# Patient Record
Sex: Female | Born: 1970
Health system: Southern US, Community
[De-identification: ages and names within clinical notes are randomized; demographics above are authoritative.]

## PROBLEM LIST (undated history)

## (undated) DIAGNOSIS — I10 Essential (primary) hypertension: Secondary | ICD-10-CM

## (undated) DIAGNOSIS — K219 Gastro-esophageal reflux disease without esophagitis: Secondary | ICD-10-CM

## (undated) DIAGNOSIS — T8859XA Other complications of anesthesia, initial encounter: Secondary | ICD-10-CM

## (undated) DIAGNOSIS — D649 Anemia, unspecified: Secondary | ICD-10-CM

## (undated) DIAGNOSIS — R011 Cardiac murmur, unspecified: Secondary | ICD-10-CM

## (undated) DIAGNOSIS — Z5189 Encounter for other specified aftercare: Secondary | ICD-10-CM

## (undated) DIAGNOSIS — F419 Anxiety disorder, unspecified: Secondary | ICD-10-CM

## (undated) HISTORY — PX: IUD REMOVAL: SHX5392

## (undated) HISTORY — DX: Gastro-esophageal reflux disease without esophagitis: K21.9

## (undated) HISTORY — DX: Essential (primary) hypertension: I10

## (undated) HISTORY — DX: Anxiety disorder, unspecified: F41.9

## (undated) HISTORY — DX: Encounter for other specified aftercare: Z51.89

## (undated) HISTORY — PX: LAPAROSCOPIC GASTRIC BANDING: SHX1100

## (undated) HISTORY — PX: TUBAL LIGATION: SHX77

---

## 2002-11-05 ENCOUNTER — Emergency Department (HOSPITAL_COMMUNITY): Admission: EM | Admit: 2002-11-05 | Discharge: 2002-11-05 | Payer: Self-pay | Admitting: Emergency Medicine

## 2010-02-23 ENCOUNTER — Ambulatory Visit: Payer: Self-pay | Admitting: Nurse Practitioner

## 2010-02-23 ENCOUNTER — Inpatient Hospital Stay (HOSPITAL_COMMUNITY)
Admission: AD | Admit: 2010-02-23 | Discharge: 2010-02-23 | Payer: Self-pay | Source: Home / Self Care | Admitting: Family Medicine

## 2010-07-13 LAB — HERPES SIMPLEX VIRUS CULTURE

## 2010-07-13 LAB — WET PREP, GENITAL
Trich, Wet Prep: NONE SEEN
Yeast Wet Prep HPF POC: NONE SEEN

## 2010-07-13 LAB — GC/CHLAMYDIA PROBE AMP, GENITAL

## 2011-06-09 ENCOUNTER — Encounter (HOSPITAL_BASED_OUTPATIENT_CLINIC_OR_DEPARTMENT_OTHER): Payer: Self-pay | Admitting: *Deleted

## 2011-06-09 ENCOUNTER — Emergency Department (HOSPITAL_BASED_OUTPATIENT_CLINIC_OR_DEPARTMENT_OTHER)
Admission: EM | Admit: 2011-06-09 | Discharge: 2011-06-09 | Disposition: A | Discharge status: Home / Self Care | Payer: 59 | Attending: Emergency Medicine | Admitting: Emergency Medicine

## 2011-06-09 DIAGNOSIS — J029 Acute pharyngitis, unspecified: Secondary | ICD-10-CM | POA: Insufficient documentation

## 2011-06-09 DIAGNOSIS — B349 Viral infection, unspecified: Secondary | ICD-10-CM

## 2011-06-09 DIAGNOSIS — D649 Anemia, unspecified: Secondary | ICD-10-CM | POA: Insufficient documentation

## 2011-06-09 DIAGNOSIS — B9789 Other viral agents as the cause of diseases classified elsewhere: Secondary | ICD-10-CM | POA: Insufficient documentation

## 2011-06-09 LAB — DIFFERENTIAL
Basophils Absolute: 0 10*3/uL (ref 0.0–0.1)
Eosinophils Absolute: 0 10*3/uL (ref 0.0–0.7)
Lymphocytes Relative: 9 % — ABNORMAL LOW (ref 12–46)
Lymphs Abs: 1.4 10*3/uL (ref 0.7–4.0)
Monocytes Absolute: 1.3 10*3/uL — ABNORMAL HIGH (ref 0.1–1.0)
Neutrophils Relative %: 83 % — ABNORMAL HIGH (ref 43–77)

## 2011-06-09 LAB — CBC
HCT: 22.3 % — ABNORMAL LOW (ref 36.0–46.0)
Hemoglobin: 6.1 g/dL — CL (ref 12.0–15.0)
RBC: 3.78 MIL/uL — ABNORMAL LOW (ref 3.87–5.11)

## 2011-06-09 LAB — BASIC METABOLIC PANEL
BUN: 9 mg/dL (ref 6–23)
Creatinine, Ser: 0.4 mg/dL — ABNORMAL LOW (ref 0.50–1.10)
GFR calc Af Amer: >90 mL/min (ref >90)
GFR calc non Af Amer: >90 mL/min (ref >90)

## 2011-06-09 LAB — RAPID STREP SCREEN (MED CTR MEBANE ONLY): Streptococcus, Group A Screen (Direct): NEGATIVE

## 2011-06-09 MED ORDER — FERROUS FUMARATE 50 MG PO TBCR: 50.0000 mg | EXTENDED_RELEASE_TABLET | Freq: Three times a day (TID) | ORAL | Status: DC

## 2011-06-09 MED ORDER — SODIUM CHLORIDE 0.9 % IV BOLUS (SEPSIS)
1000.0000 mL | Freq: Once | INTRAVENOUS | Status: AC
  Administered 2011-06-09: 1000 mL via INTRAVENOUS

## 2011-06-09 NOTE — ED Notes (Signed)
Sore throat for 3 days fever at intervals last took tylenol at 1100 today also having some right ear pain

## 2011-06-09 NOTE — ED Provider Notes (Signed)
History     CSN: [id number]  Arrival date & time 06/09/11  1331   First MD Initiated Contact with Patient 06/09/11 1356      Chief Complaint  Patient presents with  . Sore Throat    (Consider location/radiation/quality/duration/timing/severity/associated sxs/prior treatment) Patient is a 41 y.o. female presenting with pharyngitis. The history is provided by the patient.  Sore Throat This is a new problem. Episode onset: 3 days ago. The problem occurs constantly. The problem has not changed since onset.Pertinent negatives include no abdominal pain and no shortness of breath. Associated symptoms comments: Fever up to 101.7. No cough, dysuria, abdominal pain, nausea, vomiting. She states she has not eaten much because her throat hurts too badly.. The symptoms are aggravated by swallowing. The symptoms are relieved by nothing. She has tried acetaminophen for the symptoms. The treatment provided no relief.    History reviewed. No pertinent past medical history.  History reviewed. No pertinent past surgical history.  History reviewed. No pertinent family history.  History  Substance Use Topics  . Smoking status: Never Smoker   . Smokeless tobacco: Not on file  . Alcohol Use: No    OB History    Grav Para Term Preterm Abortions TAB SAB Ect Mult Living                  Review of Systems  Respiratory: Negative for shortness of breath.   Gastrointestinal: Negative for abdominal pain.  All other systems reviewed and are negative.    Allergies  Review of patient's allergies indicates no known allergies.  Home Medications  No current outpatient prescriptions on file.  BP 115/74  Pulse 98  Temp(Src) 98.3 F (36.8 C) (Oral)  Resp 20  SpO2 99%  LMP 05/05/2011  Physical Exam  Nursing note and vitals reviewed. Constitutional: She is oriented to person, place, and time. She appears well-developed and well-nourished. No distress.  HENT:  Head: Normocephalic and atraumatic.    Right Ear: Tympanic membrane and ear canal normal.  Left Ear: Tympanic membrane and ear canal normal.  Nose: Mucosal edema present. No rhinorrhea.  Mouth/Throat: Mucous membranes are normal. Posterior oropharyngeal erythema present. No oropharyngeal exudate.  Eyes: EOM are normal. Pupils are equal, round, and reactive to light.  Cardiovascular: Normal rate, regular rhythm and intact distal pulses.  Exam reveals no friction rub.   Murmur heard. Pulmonary/Chest: Effort normal and breath sounds normal. She has no wheezes. She has no rales.  Abdominal: Soft. Bowel sounds are normal. She exhibits no distension. There is no tenderness. There is no rebound and no guarding.  Musculoskeletal: Normal range of motion. She exhibits no tenderness.       No edema  Lymphadenopathy:    She has cervical adenopathy.  Neurological: She is alert and oriented to person, place, and time. No cranial nerve deficit.  Skin: Skin is warm and dry. No rash noted.  Psychiatric: She has a normal mood and affect. Her behavior is normal.    ED Course  Procedures (including critical care time)  Labs Reviewed  CBC - Abnormal; Notable for the following:    WBC 15.7 (*)    RBC 3.78 (*)    Hemoglobin 6.1 (*)    HCT 22.3 (*)    MCV 59.0 (*)    MCH 16.1 (*)    MCHC 27.4 (*)    RDW 20.5 (*)    All other components within normal limits  BASIC METABOLIC PANEL - Abnormal; Notable for the following:  Potassium 3.3 (*)    Creatinine, Ser 0.40 (*)    All other components within normal limits  RAPID STREP SCREEN  DIFFERENTIAL   No results found.   No diagnosis found.    MDM   Pt with symptoms consistent with viral URI and pharyngitis.  Well appearing here.  No signs of breathing difficulty  No signs of otitis or abnormal abdominal findings.   Rapid strep neg.  Will have her continue po fluids and louzenges.   2:46 PM On reevaluation patient admits to having a lab a.m. procedure done 6 years ago having  intermittent bouts of dehydration and hypokalemia. She also states she's been anemic for 2 years with hemoglobins as low as 6 or 7. She's always refused transfusions. She states that she has very heavy periods and that is most likely causing her symptoms of anemia. She has a murmur today and it seems that her hemoglobin is not much higher than 7. She states it was last checked in November and was around 7. She denies any dizziness, shortness of breath, chest pain or other symptoms of anemia. Will give IV fluids and check a CBC and BMP.  3:35 PM CBC with a leukocytosis of 15,000 and anemia with a hemoglobin of 6.1. Given her prior history of this is not far off her normal. She did not want blood transfusions but will start iron. BMP within normal limits.  Will give referrals for OB/GYN and PCP followup.   Gwyneth Sprout, MD 06/09/11 [phone]

## 2012-08-26 ENCOUNTER — Other Ambulatory Visit: Payer: Self-pay

## 2012-10-02 ENCOUNTER — Other Ambulatory Visit: Payer: Self-pay

## 2012-10-02 DIAGNOSIS — Z1231 Encounter for screening mammogram for malignant neoplasm of breast: Secondary | ICD-10-CM

## 2012-10-03 ENCOUNTER — Telehealth: Payer: Self-pay | Admitting: Oncology

## 2012-10-03 NOTE — Telephone Encounter (Signed)
S/W PT IN RE NP APPT ON 06/12 @ 3 W/DR. HA REFERRING DR. Susa Day MODY DX- ABN CBC  WELCOME PACKET MAILED.

## 2012-10-04 ENCOUNTER — Telehealth: Payer: Self-pay | Admitting: Oncology

## 2012-10-04 NOTE — Telephone Encounter (Signed)
C/D 10/04/12 for appt. 10/10/12 °

## 2012-10-04 NOTE — Telephone Encounter (Signed)
C/D 10/04/12 for appt. 10/10/12

## 2012-10-10 ENCOUNTER — Ambulatory Visit: Payer: 59

## 2012-10-10 ENCOUNTER — Other Ambulatory Visit: Payer: 59 | Admitting: Lab

## 2012-10-10 ENCOUNTER — Ambulatory Visit: Payer: 59 | Admitting: Oncology

## 2012-10-11 ENCOUNTER — Ambulatory Visit: Payer: 59

## 2012-10-30 ENCOUNTER — Ambulatory Visit: Payer: 59

## 2012-11-13 ENCOUNTER — Ambulatory Visit
Admission: RE | Admit: 2012-11-13 | Discharge: 2012-11-13 | Disposition: A | Discharge status: Home / Self Care | Payer: 59 | Source: Ambulatory Visit

## 2012-11-13 DIAGNOSIS — Z1231 Encounter for screening mammogram for malignant neoplasm of breast: Secondary | ICD-10-CM

## 2014-09-01 ENCOUNTER — Encounter (HOSPITAL_COMMUNITY): Payer: Self-pay | Admitting: *Deleted

## 2014-09-01 ENCOUNTER — Emergency Department (HOSPITAL_COMMUNITY): Payer: No Typology Code available for payment source

## 2014-09-01 ENCOUNTER — Emergency Department (HOSPITAL_COMMUNITY)
Admission: EM | Admit: 2014-09-01 | Discharge: 2014-09-01 | Disposition: A | Discharge status: Home / Self Care | Payer: No Typology Code available for payment source | Attending: Emergency Medicine | Admitting: Emergency Medicine

## 2014-09-01 DIAGNOSIS — Y9389 Activity, other specified: Secondary | ICD-10-CM | POA: Insufficient documentation

## 2014-09-01 DIAGNOSIS — S20212A Contusion of left front wall of thorax, initial encounter: Secondary | ICD-10-CM | POA: Insufficient documentation

## 2014-09-01 DIAGNOSIS — S161XXA Strain of muscle, fascia and tendon at neck level, initial encounter: Secondary | ICD-10-CM | POA: Insufficient documentation

## 2014-09-01 DIAGNOSIS — S4992XA Unspecified injury of left shoulder and upper arm, initial encounter: Secondary | ICD-10-CM | POA: Diagnosis not present

## 2014-09-01 DIAGNOSIS — Y92410 Unspecified street and highway as the place of occurrence of the external cause: Secondary | ICD-10-CM | POA: Insufficient documentation

## 2014-09-01 DIAGNOSIS — Y998 Other external cause status: Secondary | ICD-10-CM | POA: Insufficient documentation

## 2014-09-01 DIAGNOSIS — S199XXA Unspecified injury of neck, initial encounter: Secondary | ICD-10-CM | POA: Diagnosis present

## 2014-09-01 MED ORDER — NAPROXEN 500 MG PO TABS: 500.0000 mg | ORAL_TABLET | Freq: Two times a day (BID) | ORAL | Status: DC

## 2014-09-01 MED ORDER — ORPHENADRINE CITRATE ER 100 MG PO TB12: 100.0000 mg | ORAL_TABLET | Freq: Two times a day (BID) | ORAL | Status: DC

## 2014-09-01 MED ORDER — CYCLOBENZAPRINE HCL 10 MG PO TABS
10.0000 mg | ORAL_TABLET | Freq: Once | ORAL | Status: AC
  Administered 2014-09-01: 10 mg via ORAL
  Filled 2014-09-01: qty 1

## 2014-09-01 MED ORDER — KETOROLAC TROMETHAMINE 60 MG/2ML IM SOLN
60.0000 mg | Freq: Once | INTRAMUSCULAR | Status: AC
  Administered 2014-09-01: 60 mg via INTRAMUSCULAR
  Filled 2014-09-01: qty 2

## 2014-09-01 NOTE — ED Notes (Signed)
I gave the patient a pair of tan large socks. 

## 2014-09-01 NOTE — Discharge Instructions (Signed)
Cervical Sprain A cervical sprain is an injury in the neck in which the strong, fibrous tissues (ligaments) that connect your neck bones stretch or tear. Cervical sprains can range from mild to severe. Severe cervical sprains can cause the neck vertebrae to be unstable. This can lead to damage of the spinal cord and can result in serious nervous system problems. The amount of time it takes for a cervical sprain to get better depends on the cause and extent of the injury. Most cervical sprains heal in 1 to 3 weeks. CAUSES  Severe cervical sprains may be caused by:   Contact sport injuries (such as from football, rugby, wrestling, hockey, auto racing, gymnastics, diving, martial arts, or boxing).   Motor vehicle collisions.   Whiplash injuries. This is an injury from a sudden forward and backward whipping movement of the head and neck.  Falls.  Mild cervical sprains may be caused by:   Being in an awkward position, such as while cradling a telephone between your ear and shoulder.   Sitting in a chair that does not offer proper support.   Working at a poorly Landscape [employer] station.   Looking up or down for long periods of time.  SYMPTOMS   Pain, soreness, stiffness, or a burning sensation in the front, back, or sides of the neck. This discomfort may develop immediately after the injury or slowly, 24 hours or more after the injury.   Pain or tenderness directly in the middle of the back of the neck.   Shoulder or upper back pain.   Limited ability to move the neck.   Headache.   Dizziness.   Weakness, numbness, or tingling in the hands or arms.   Muscle spasms.   Difficulty swallowing or chewing.   Tenderness and swelling of the neck.  DIAGNOSIS  Most of the time your health care provider can diagnose a cervical sprain by taking your history and doing a physical exam. Your health care provider will ask about previous neck injuries and any known neck  problems, such as arthritis in the neck. X-rays may be taken to find out if there are any other problems, such as with the bones of the neck. Other tests, such as a CT scan or MRI, may also be needed.  TREATMENT  Treatment depends on the severity of the cervical sprain. Mild sprains can be treated with rest, keeping the neck in place (immobilization), and pain medicines. Severe cervical sprains are immediately immobilized. Further treatment is done to help with pain, muscle spasms, and other symptoms and may include:  Medicines, such as pain relievers, numbing medicines, or muscle relaxants.   Physical therapy. This may involve stretching exercises, strengthening exercises, and posture training. Exercises and improved posture can help stabilize the neck, strengthen muscles, and help stop symptoms from returning.  HOME CARE INSTRUCTIONS   Put ice on the injured area.   Put ice in a plastic bag.   Place a towel between your skin and the bag.   Leave the ice on for 15-20 minutes, 3-4 times a day.   If your injury was severe, you may have been given a cervical collar to wear. A cervical collar is a two-piece collar designed to keep your neck from moving while it heals.  Do not remove the collar unless instructed by your health care provider.  If you have long hair, keep it outside of the collar.  Ask your health care provider before making any adjustments to your collar. Minor  adjustments may be required over time to improve comfort and reduce pressure on your chin or on the back of your head.  Ifyou are allowed to remove the collar for cleaning or bathing, follow your health care provider's instructions on how to do so safely.  Keep your collar clean by wiping it with mild soap and water and drying it completely. If the collar you have been given includes removable pads, remove them every 1-2 days and hand wash them with soap and water. Allow them to air dry. They should be completely  dry before you wear them in the collar.  If you are allowed to remove the collar for cleaning and bathing, wash and dry the skin of your neck. Check your skin for irritation or sores. If you see any, tell your health care provider.  Do not drive while wearing the collar.   Only take over-the-counter or prescription medicines for pain, discomfort, or fever as directed by your health care provider.   Keep all follow-up appointments as directed by your health care provider.   Keep all physical therapy appointments as directed by your health care provider.   Make any needed adjustments to your workstation to promote good posture.   Avoid positions and activities that make your symptoms worse.   Warm up and stretch before being active to help prevent problems.  SEEK MEDICAL CARE IF:   Your pain is not controlled with medicine.   You are unable to decrease your pain medicine over time as planned.   Your activity level is not improving as expected.  SEEK IMMEDIATE MEDICAL CARE IF:   You develop any bleeding.  You develop stomach upset.  You have signs of an allergic reaction to your medicine.   Your symptoms get worse.   You develop new, unexplained symptoms.   You have numbness, tingling, weakness, or paralysis in any part of your body.  MAKE SURE YOU:   Understand these instructions.  Will watch your condition.  Will get help right away if you are not doing well or get worse. Document Released: 02/12/2007 Document Revised: 04/22/2013 Document Reviewed: 10/23/2012 [facility] Patient Information 2015 [place], [place]. This information is not intended to replace advice given to you by your health care provider. Make sure you discuss any questions you have with your health care provider.  Blunt Chest Trauma Blunt chest trauma is an injury caused by a blow to the chest. These chest injuries can be very painful. Blunt chest trauma often results in bruised or broken  (fractured) ribs. Most cases of bruised and fractured ribs from blunt chest traumas get better after 1 to 3 weeks of rest and pain medicine. Often, the soft tissue in the chest wall is also injured, causing pain and bruising. Internal organs, such as the heart and lungs, may also be injured. Blunt chest trauma can lead to serious medical problems. This injury requires immediate medical care. CAUSES   Motor vehicle collisions.  Falls.  Physical violence.  Sports injuries. SYMPTOMS   Chest pain. The pain may be worse when you move or breathe deeply.  Shortness of breath.  Lightheadedness.  Bruising.  Tenderness.  Swelling. DIAGNOSIS  Your caregiver will do a physical exam. X-rays may be taken to look for fractures. However, minor rib fractures may not show up on X-rays until a few days after the injury. If a more serious injury is suspected, further imaging tests may be done. This may include ultrasounds, computed tomography (CT) scans, or magnetic  resonance imaging (MRI). TREATMENT  Treatment depends on the severity of your injury. Your caregiver may prescribe pain medicines and deep breathing exercises. HOME CARE INSTRUCTIONS  Limit your activities until you can move around without much pain.  Do not do any strenuous work until your injury is healed.  Put ice on the injured area.  Put ice in a plastic bag.  Place a towel between your skin and the bag.  Leave the ice on for 15-20 minutes, 03-04 times a day.  You may wear a rib belt as directed by your caregiver to reduce pain.  Practice deep breathing as directed by your caregiver to keep your lungs clear.  Only take over-the-counter or prescription medicines for pain, fever, or discomfort as directed by your caregiver. SEEK IMMEDIATE MEDICAL CARE IF:   You have increasing pain or shortness of breath.  You cough up blood.  You have nausea, vomiting, or abdominal pain.  You have a fever.  You feel dizzy, weak, or  you faint. MAKE SURE YOU:  Understand these instructions.  Will watch your condition.  Will get help right away if you are not doing well or get worse. Document Released: 05/25/2004 Document Revised: 07/10/2011 Document Reviewed: 02/01/2011 [facility] Patient Information 2015 [place], [place]. This information is not intended to replace advice given to you by your health care provider. Make sure you discuss any questions you have with your health care provider. Motor Vehicle Collision It is common to have multiple bruises and sore muscles after a motor vehicle collision (MVC). These tend to feel worse for the first 24 hours. You may have the most stiffness and soreness over the first several hours. You may also feel worse when you wake up the first morning after your collision. After this point, you will usually begin to improve with each day. The speed of improvement often depends on the severity of the collision, the number of injuries, and the location and nature of these injuries. HOME CARE INSTRUCTIONS  Put ice on the injured area.  Put ice in a plastic bag.  Place a towel between your skin and the bag.  Leave the ice on for 15-20 minutes, 3-4 times a day, or as directed by your health care provider.  Drink enough fluids to keep your urine clear or pale yellow. Do not drink alcohol.  Take a warm shower or bath once or twice a day. This will increase blood flow to sore muscles.  You may return to activities as directed by your caregiver. Be careful when lifting, as this may aggravate neck or back pain.  Only take over-the-counter or prescription medicines for pain, discomfort, or fever as directed by your caregiver. Do not use aspirin. This may increase bruising and bleeding. SEEK IMMEDIATE MEDICAL CARE IF:  You have numbness, tingling, or weakness in the arms or legs.  You develop severe headaches not relieved with medicine.  You have severe neck pain, especially tenderness in  the middle of the back of your neck.  You have changes in bowel or bladder control.  There is increasing pain in any area of the body.  You have shortness of breath, light-headedness, dizziness, or fainting.  You have chest pain.  You feel sick to your stomach (nauseous), throw up (vomit), or sweat.  You have increasing abdominal discomfort.  There is blood in your urine, stool, or vomit.  You have pain in your shoulder (shoulder strap areas).  You feel your symptoms are getting worse. MAKE SURE YOU:  Understand these instructions. °· Will watch your condition. °· Will get help right away if you are not doing well or get worse. °Document Released: 04/17/2005 Document Revised: 09/01/2013 Document Reviewed: 09/14/2010 °[employer]® Patient Information ©2015 [employer]. This information is not intended to replace advice given to you by your health care provider. Make sure you discuss any questions you have with your health care provider. ° °

## 2014-09-01 NOTE — ED Notes (Signed)
Slight bruise developing over left shoulder. Pt is tender to palpation on her left anterior chest wall and clavicle and is tender on left paraspinal upon movement and palpation. Pt is also tender upon plapation on c6 and c7.
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
## 2014-09-01 NOTE — ED Notes (Signed)
I gave the patient a cup of ice per nurse Elmyra Ricks.

## 2014-09-01 NOTE — ED Notes (Signed)
I gave the patient a large ice pack per nurse Elmyra Ricks.
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
## 2014-09-01 NOTE — ED Notes (Signed)
Pt is in stable condition upon d/c and ambulates from ED. 

## 2015-07-13 ENCOUNTER — Emergency Department (HOSPITAL_BASED_OUTPATIENT_CLINIC_OR_DEPARTMENT_OTHER)
Admission: EM | Admit: 2015-07-13 | Discharge: 2015-07-13 | Disposition: A | Discharge status: Home / Self Care | Payer: Commercial Managed Care - HMO | Attending: Emergency Medicine | Admitting: Emergency Medicine

## 2015-07-13 ENCOUNTER — Encounter (HOSPITAL_BASED_OUTPATIENT_CLINIC_OR_DEPARTMENT_OTHER): Payer: Self-pay | Admitting: *Deleted

## 2015-07-13 ENCOUNTER — Emergency Department (HOSPITAL_BASED_OUTPATIENT_CLINIC_OR_DEPARTMENT_OTHER): Payer: Commercial Managed Care - HMO

## 2015-07-13 DIAGNOSIS — Z8679 Personal history of other diseases of the circulatory system: Secondary | ICD-10-CM | POA: Insufficient documentation

## 2015-07-13 DIAGNOSIS — Z79899 Other long term (current) drug therapy: Secondary | ICD-10-CM | POA: Diagnosis not present

## 2015-07-13 DIAGNOSIS — Z862 Personal history of diseases of the blood and blood-forming organs and certain disorders involving the immune mechanism: Secondary | ICD-10-CM | POA: Insufficient documentation

## 2015-07-13 DIAGNOSIS — R51 Headache: Secondary | ICD-10-CM | POA: Insufficient documentation

## 2015-07-13 DIAGNOSIS — Z791 Long term (current) use of non-steroidal anti-inflammatories (NSAID): Secondary | ICD-10-CM | POA: Diagnosis not present

## 2015-07-13 DIAGNOSIS — R519 Headache, unspecified: Secondary | ICD-10-CM

## 2015-07-13 HISTORY — DX: Anemia, unspecified: D64.9

## 2015-07-13 NOTE — ED Notes (Signed)
Pt c/o right head on top of head and into ear since yesterday

## 2015-07-13 NOTE — ED Notes (Signed)
Patient transported to CT and returned. Ambulatory without difficulty

## 2015-07-13 NOTE — Discharge Instructions (Signed)
Ibuprofen 600 mg every six hours as needed for pain.  Follow up with your doctor if not improving in the next few days.   General Headache Without Cause A headache is pain or discomfort felt around the head or neck area. The specific cause of a headache may not be found. There are many causes and types of headaches. A few common ones are:  Tension headaches.  Migraine headaches.  Cluster headaches.  Chronic daily headaches. HOME CARE INSTRUCTIONS  Watch your condition for any changes. Take these steps to help with your condition: Managing Pain  Take over-the-counter and prescription medicines only as told by your health care provider.  Lie down in a dark, quiet room when you have a headache.  If directed, apply ice to the head and neck area:  Put ice in a plastic bag.  Place a towel between your skin and the bag.  Leave the ice on for 20 minutes, 2-3 times per day.  Use a heating pad or hot shower to apply heat to the head and neck area as told by your health care provider.  Keep lights dim if bright lights bother you or make your headaches worse. Eating and Drinking  Eat meals on a regular schedule.  Limit alcohol use.  Decrease the amount of caffeine you drink, or stop drinking caffeine. General Instructions  Keep all follow-up visits as told by your health care provider. This is important.  Keep a headache journal to help find out what may trigger your headaches. For example, write down:  What you eat and drink.  How much sleep you get.  Any change to your diet or medicines.  Try massage or other relaxation techniques.  Limit stress.  Sit up straight, and do not tense your muscles.  Do not use tobacco products, including cigarettes, chewing tobacco, or e-cigarettes. If you need help quitting, ask your health care provider.  Exercise regularly as told by your health care provider.  Sleep on a regular schedule. Get 7-9 hours of sleep, or the amount  recommended by your health care provider. SEEK MEDICAL CARE IF:   Your symptoms are not helped by medicine.  You have a headache that is different from the usual headache.  You have nausea or you vomit.  You have a fever. SEEK IMMEDIATE MEDICAL CARE IF:   Your headache becomes severe.  You have repeated vomiting.  You have a stiff neck.  You have a loss of vision.  You have problems with speech.  You have pain in the eye or ear.  You have muscular weakness or loss of muscle control.  You lose your balance or have trouble walking.  You feel faint or pass out.  You have confusion.   This information is not intended to replace advice given to you by your health care provider. Make sure you discuss any questions you have with your health care provider.   Document Released: 04/17/2005 Document Revised: 01/06/2015 Document Reviewed: 08/10/2014 Elsevier Interactive Patient Education [employer].

## 2015-07-13 NOTE — ED Provider Notes (Signed)
CSN: [id number]     Arrival date & time 07/13/15  [id number] History   First MD Initiated Contact with Patient 07/13/15 1016     Chief Complaint  Patient presents with  . Headache    head pain, not headache     (Consider location/radiation/quality/duration/timing/severity/associated sxs/prior Treatment) HPI Comments: Patient is a 45 year old female with no significant past medical history. She presents for evaluation of head pain. She describes pain to the top of her head and her right parietal region. This began 2 days ago in the absence of any injury or trauma. She denies any nausea, visual disturbances, numbness or tingling.  Patient is a 45 y.o. female presenting with headaches. The history is provided by the patient.  Headache Pain location:  R parietal Quality:  Stabbing Radiates to:  Face Onset quality:  Sudden Duration:  2 days Timing:  Intermittent Progression:  Worsening Chronicity:  New Similar to prior headaches: no   Relieved by:  Nothing Worsened by:  Nothing   Past Medical History  Diagnosis Date  . Anemia    Past Surgical History  Procedure Laterality Date  . Laparoscopic gastric banding    . Tubal ligation     No family history on file. Social History  Substance Use Topics  . Smoking status: Never Smoker   . Smokeless tobacco: Never Used  . Alcohol Use: No   OB History    No data available     Review of Systems  Neurological: Positive for headaches.  All other systems reviewed and are negative.     Allergies  Review of patient's allergies indicates no known allergies.  Home Medications   Prior to Admission medications   Medication Sig Start Date End Date Taking? Authorizing Provider  naproxen (NAPROSYN) 500 MG tablet Take 1 tablet (500 mg total) by mouth 2 (two) times daily. 09/01/14   Charlesetta Shanks, MD  orphenadrine (NORFLEX) 100 MG tablet Take 1 tablet (100 mg total) by mouth 2 (two) times daily. 09/01/14   Charlesetta Shanks, MD   BP 120/70 mmHg   Pulse 80  Temp(Src) 98.3 F (36.8 C) (Oral)  Resp 16  Ht 5' (1.524 m)  Wt 126 lb (57.153 kg)  BMI 24.61 kg/m2  SpO2 100%  LMP 06/27/2015 Physical Exam  Constitutional: She is oriented to person, place, and time. She appears well-developed and well-nourished. No distress.  HENT:  Head: Normocephalic and atraumatic.  TMs are clear bilaterally.  Patient has hair extensions in place, however there is no obvious scalp lesion or swelling. She is tender in the soft tissues to the upper scalp and parietal region.  Eyes: EOM are normal. Pupils are equal, round, and reactive to light.  Neck: Normal range of motion. Neck supple.  Cardiovascular: Normal rate and regular rhythm.  Exam reveals no gallop and no friction rub.   No murmur heard. Pulmonary/Chest: Effort normal and breath sounds normal. No respiratory distress. She has no wheezes.  Abdominal: Soft. Bowel sounds are normal. She exhibits no distension. There is no tenderness.  Musculoskeletal: Normal range of motion.  Neurological: She is alert and oriented to person, place, and time. No cranial nerve deficit. She exhibits normal muscle tone. Coordination normal.  Skin: Skin is warm and dry. She is not diaphoretic.  Nursing note and vitals reviewed.   ED Course  Procedures (including critical care time) Labs Review Labs Reviewed - No data to display  Imaging Review No results found. I have personally reviewed and evaluated these images and  lab results as part of my medical decision-making.   MDM   Final diagnoses:  None   Patient presents here with complaints of pain to her scalp and right side of her head. She has a history of aneurysms in her family and is concerned that this may be the case. Head CT was negative. Her pain seems more superficial and involving the scalp although there are no obvious abnormalities on exam. I doubt anything emergent. Her symptoms are not classic for temporal arteritis and she is only 45 years  of age. We will recommend anti-inflammatories, time, and when necessary follow-up if she is not improving.    Veryl Speak, MD 07/13/15 1520

## 2015-07-13 NOTE — ED Notes (Signed)
Ambulatory with steady gait at discharge. Resources discussed to find PCP

## 2015-07-13 NOTE — ED Notes (Signed)
MD at bedside. 

## 2015-10-06 ENCOUNTER — Encounter (HOSPITAL_BASED_OUTPATIENT_CLINIC_OR_DEPARTMENT_OTHER): Payer: Self-pay | Admitting: *Deleted

## 2015-10-06 ENCOUNTER — Emergency Department (HOSPITAL_BASED_OUTPATIENT_CLINIC_OR_DEPARTMENT_OTHER)
Admission: EM | Admit: 2015-10-06 | Discharge: 2015-10-06 | Disposition: A | Discharge status: Home / Self Care | Payer: Commercial Managed Care - HMO | Attending: Emergency Medicine | Admitting: Emergency Medicine

## 2015-10-06 DIAGNOSIS — R200 Anesthesia of skin: Secondary | ICD-10-CM | POA: Diagnosis not present

## 2015-10-06 DIAGNOSIS — Y999 Unspecified external cause status: Secondary | ICD-10-CM | POA: Insufficient documentation

## 2015-10-06 DIAGNOSIS — S46912A Strain of unspecified muscle, fascia and tendon at shoulder and upper arm level, left arm, initial encounter: Secondary | ICD-10-CM | POA: Diagnosis not present

## 2015-10-06 DIAGNOSIS — Y929 Unspecified place or not applicable: Secondary | ICD-10-CM | POA: Diagnosis not present

## 2015-10-06 DIAGNOSIS — X58XXXA Exposure to other specified factors, initial encounter: Secondary | ICD-10-CM | POA: Diagnosis not present

## 2015-10-06 DIAGNOSIS — Y939 Activity, unspecified: Secondary | ICD-10-CM | POA: Insufficient documentation

## 2015-10-06 DIAGNOSIS — K5900 Constipation, unspecified: Secondary | ICD-10-CM | POA: Insufficient documentation

## 2015-10-06 DIAGNOSIS — M25552 Pain in left hip: Secondary | ICD-10-CM | POA: Diagnosis not present

## 2015-10-06 DIAGNOSIS — M25512 Pain in left shoulder: Secondary | ICD-10-CM | POA: Diagnosis present

## 2015-10-06 MED ORDER — CYCLOBENZAPRINE HCL 10 MG PO TABS: 5.0000 mg | ORAL_TABLET | Freq: Three times a day (TID) | ORAL | Status: DC | PRN

## 2015-10-06 MED FILL — CYCLOBENZAPRINE 10 MG TAB: 10 | 10 days supply | Qty: 30 | Fill #0
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
## 2015-10-06 NOTE — ED Notes (Addendum)
Pt states she was seen here for pain in top of head. Pt states she was given prescriptions at that time and never filled them. After she was seen here she states she has had pain in left arm around deltoid area and tingling. Also felt pain in left eye a few weeks ago but none today. Also has intermittant pain in left hip after sitting. Today only sx is left deltoid area tingling.

## 2015-11-18 ENCOUNTER — Other Ambulatory Visit: Payer: Self-pay | Admitting: Orthopedic Surgery

## 2015-11-18 DIAGNOSIS — M25512 Pain in left shoulder: Secondary | ICD-10-CM

## 2015-11-18 DIAGNOSIS — M542 Cervicalgia: Secondary | ICD-10-CM

## 2015-11-28 ENCOUNTER — Other Ambulatory Visit: Payer: Commercial Managed Care - HMO

## 2016-01-18 ENCOUNTER — Other Ambulatory Visit: Payer: Self-pay | Admitting: General Practice

## 2016-01-18 ENCOUNTER — Other Ambulatory Visit: Payer: Self-pay

## 2016-01-18 DIAGNOSIS — N63 Unspecified lump in unspecified breast: Secondary | ICD-10-CM

## 2016-05-31 ENCOUNTER — Other Ambulatory Visit: Payer: Self-pay | Admitting: Nurse Practitioner

## 2016-05-31 DIAGNOSIS — N63 Unspecified lump in unspecified breast: Secondary | ICD-10-CM

## 2016-06-02 ENCOUNTER — Ambulatory Visit
Admission: RE | Admit: 2016-06-02 | Discharge: 2016-06-02 | Disposition: A | Discharge status: Home / Self Care | Payer: Commercial Managed Care - HMO | Source: Ambulatory Visit | Attending: Nurse Practitioner | Admitting: Nurse Practitioner

## 2016-06-02 DIAGNOSIS — N63 Unspecified lump in unspecified breast: Secondary | ICD-10-CM
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
## 2017-11-29 ENCOUNTER — Other Ambulatory Visit: Payer: Self-pay | Admitting: Nurse Practitioner

## 2017-11-29 DIAGNOSIS — Z1231 Encounter for screening mammogram for malignant neoplasm of breast: Secondary | ICD-10-CM

## 2017-12-25 ENCOUNTER — Ambulatory Visit
Admission: RE | Admit: 2017-12-25 | Discharge: 2017-12-25 | Disposition: A | Discharge status: Home / Self Care | Payer: Commercial Managed Care - HMO | Source: Ambulatory Visit | Attending: Nurse Practitioner | Admitting: Nurse Practitioner

## 2017-12-25 DIAGNOSIS — Z1231 Encounter for screening mammogram for malignant neoplasm of breast: Secondary | ICD-10-CM

## 2019-02-19 ENCOUNTER — Other Ambulatory Visit: Payer: Self-pay | Admitting: Nurse Practitioner

## 2019-02-19 DIAGNOSIS — Z1231 Encounter for screening mammogram for malignant neoplasm of breast: Secondary | ICD-10-CM

## 2019-04-09 ENCOUNTER — Other Ambulatory Visit: Payer: Self-pay

## 2019-04-09 ENCOUNTER — Ambulatory Visit
Admission: RE | Admit: 2019-04-09 | Discharge: 2019-04-09 | Disposition: A | Discharge status: Home / Self Care | Payer: 59 | Source: Ambulatory Visit | Attending: Nurse Practitioner | Admitting: Nurse Practitioner

## 2019-04-09 DIAGNOSIS — Z1231 Encounter for screening mammogram for malignant neoplasm of breast: Secondary | ICD-10-CM

## 2019-04-10 ENCOUNTER — Other Ambulatory Visit: Payer: Self-pay | Admitting: Nurse Practitioner

## 2019-04-10 ENCOUNTER — Other Ambulatory Visit: Payer: Self-pay | Admitting: Chiropractic Medicine

## 2019-04-10 DIAGNOSIS — R928 Other abnormal and inconclusive findings on diagnostic imaging of breast: Secondary | ICD-10-CM

## 2019-04-15 ENCOUNTER — Ambulatory Visit: Payer: 59

## 2019-04-15 ENCOUNTER — Other Ambulatory Visit: Payer: Self-pay

## 2019-04-15 ENCOUNTER — Ambulatory Visit
Admission: RE | Admit: 2019-04-15 | Discharge: 2019-04-15 | Disposition: A | Discharge status: Home / Self Care | Payer: 59 | Source: Ambulatory Visit | Attending: Nurse Practitioner | Admitting: Nurse Practitioner

## 2019-04-15 DIAGNOSIS — R928 Other abnormal and inconclusive findings on diagnostic imaging of breast: Secondary | ICD-10-CM

## 2019-06-20 ENCOUNTER — Ambulatory Visit: Payer: 59 | Attending: Internal Medicine

## 2019-06-20 DIAGNOSIS — Z23 Encounter for immunization: Secondary | ICD-10-CM

## 2019-06-20 NOTE — Progress Notes (Signed)
   Covid-19 Vaccination Clinic  Name:  Armentha Greening    MRN: [id number] DOB: 03-19-1971  06/20/2019  Ms. Raga was observed post Covid-19 immunization for 15 minutes without incidence. She was provided with Vaccine Information Sheet and instruction to access the V-Safe system.   Ms. Morsey was instructed to call 911 with any severe reactions post vaccine: Marland Kitchen Difficulty breathing  . Swelling of your face and throat  . A fast heartbeat  . A bad rash all over your body  . Dizziness and weakness    Immunizations Administered    Name Date Dose VIS Date Route   Pfizer COVID-19 Vaccine 06/20/2019 11:29 AM 0.3 mL 04/11/2019 Intramuscular   Manufacturer: [place]   Lot: [id number]   [place]: [id number]

## 2019-07-15 ENCOUNTER — Ambulatory Visit: Payer: 59 | Attending: Internal Medicine

## 2019-07-15 DIAGNOSIS — Z23 Encounter for immunization: Secondary | ICD-10-CM

## 2019-07-15 NOTE — Progress Notes (Signed)
   Covid-19 Vaccination Clinic  Name:  Tonya Barber    MRN: [id number] DOB: 10-Feb-1971  07/15/2019  [name] was observed post Covid-19 immunization for 15 minutes without incident. She was provided with Vaccine Information Sheet and instruction to access the V-Safe system.   [name] was instructed to call 911 with any severe reactions post vaccine: Marland Kitchen Difficulty breathing  . Swelling of face and throat  . A fast heartbeat  . A bad rash all over body  . Dizziness and weakness   Immunizations Administered    Name Date Dose VIS Date Route   Pfizer COVID-19 Vaccine 07/15/2019  8:24 AM 0.3 mL 04/11/2019 Intramuscular   Manufacturer: [place]   Lot: [id number]   [place]: [id number]

## 2019-11-23 ENCOUNTER — Emergency Department (HOSPITAL_COMMUNITY): Payer: 59

## 2019-11-23 ENCOUNTER — Other Ambulatory Visit: Payer: Self-pay

## 2019-11-23 ENCOUNTER — Encounter (HOSPITAL_COMMUNITY): Payer: Self-pay | Admitting: Emergency Medicine

## 2019-11-23 ENCOUNTER — Emergency Department (HOSPITAL_COMMUNITY)
Admission: EM | Admit: 2019-11-23 | Discharge: 2019-11-23 | Disposition: A | Discharge status: Home / Self Care | Payer: 59 | Attending: Emergency Medicine | Admitting: Emergency Medicine

## 2019-11-23 DIAGNOSIS — N921 Excessive and frequent menstruation with irregular cycle: Secondary | ICD-10-CM

## 2019-11-23 DIAGNOSIS — R0789 Other chest pain: Secondary | ICD-10-CM | POA: Diagnosis present

## 2019-11-23 DIAGNOSIS — D500 Iron deficiency anemia secondary to blood loss (chronic): Secondary | ICD-10-CM | POA: Diagnosis not present

## 2019-11-23 DIAGNOSIS — R5383 Other fatigue: Secondary | ICD-10-CM | POA: Insufficient documentation

## 2019-11-23 LAB — BASIC METABOLIC PANEL
Anion gap: 8 (ref 5–15)
BUN: 10 mg/dL (ref 6–20)
CO2: 22 mmol/L (ref 22–32)
Calcium: 9 mg/dL (ref 8.9–10.3)
Chloride: 110 mmol/L (ref 98–111)
Creatinine, Ser: 0.48 mg/dL (ref 0.44–1.00)
GFR calc Af Amer: >60 mL/min (ref >60)
GFR calc non Af Amer: >60 mL/min (ref >60)
Glucose, Bld: 106 mg/dL — ABNORMAL HIGH (ref 70–99)
Potassium: 3.4 mmol/L — ABNORMAL LOW (ref 3.5–5.1)
Sodium: 140 mmol/L (ref 135–145)

## 2019-11-23 LAB — CBC
HCT: 19.3 % — ABNORMAL LOW (ref 36.0–46.0)
Hemoglobin: 4.6 g/dL — CL (ref 12.0–15.0)
MCH: 14.3 pg — ABNORMAL LOW (ref 26.0–34.0)
MCHC: 23.8 g/dL — ABNORMAL LOW (ref 30.0–36.0)
MCV: 60.1 fL — ABNORMAL LOW (ref 80.0–100.0)
Platelets: 261 10*3/uL (ref 150–400)
RBC: 3.21 MIL/uL — ABNORMAL LOW (ref 3.87–5.11)
RDW: 24.9 % — ABNORMAL HIGH (ref 11.5–15.5)
WBC: 4 10*3/uL (ref 4.0–10.5)
nRBC: 0.8 % — ABNORMAL HIGH (ref 0.0–0.2)

## 2019-11-23 LAB — TROPONIN I (HIGH SENSITIVITY)
Troponin I (High Sensitivity): <2 ng/L (ref <18)
Troponin I (High Sensitivity): <2 ng/L (ref <18)

## 2019-11-23 LAB — ABO/RH: ABO/RH(D): A POS

## 2019-11-23 LAB — PREPARE RBC (CROSSMATCH)

## 2019-11-23 MED ORDER — SODIUM CHLORIDE 0.9 % IV SOLN
10.0000 mL/h | Freq: Once | INTRAVENOUS | Status: AC
  Administered 2019-11-23: 10 mL/h via INTRAVENOUS

## 2019-11-23 MED ORDER — SODIUM CHLORIDE 0.9% FLUSH
3.0000 mL | Freq: Once | INTRAVENOUS | Status: AC
  Administered 2019-11-23: 3 mL via INTRAVENOUS

## 2019-11-23 MED ORDER — PROMETHAZINE HCL 25 MG/ML IJ SOLN: 12.5000 mg | Freq: Once | INTRAMUSCULAR | Status: DC

## 2019-11-23 NOTE — ED Provider Notes (Signed)
[facility] EMERGENCY DEPARTMENT Provider Note   CSN: [id number] Arrival date & time: 11/23/19  0410     History Chief Complaint  Patient presents with   Chest Pain    Tonya Barber is a 49 y.o. female.  Patient presents to the emergency department for evaluation of chest pain.  Patient reports that she was awakened from sleep this morning with a sharp pain in the left side of her chest.  Pain has been present throughout the day and has been unchanged.  Not related to exertion but she has noticed fatigue.  Patient reports that she has very heavy menstrual periods, just finished her cycle.     HPI: A 49 year old patient presents for evaluation of chest pain. Initial onset of pain was more than 6 hours ago. The patient's chest pain is sharp and is not worse with exertion. The patient's chest pain is middle- or left-sided, is not well-localized, is not described as heaviness/pressure/tightness and does radiate to the arms/jaw/neck. The patient does not complain of nausea and denies diaphoresis. The patient has no history of stroke, has no history of peripheral artery disease, has not smoked in the past 90 days, denies any history of treated diabetes, has no relevant family history of coronary artery disease (first degree relative at less than age 79), is not hypertensive, has no history of hypercholesterolemia and does not have an elevated BMI (>=30).   Past Medical History:  Diagnosis Date   Anemia     There are no problems to display for this patient.   Past Surgical History:  Procedure Laterality Date   LAPAROSCOPIC GASTRIC BANDING     TUBAL LIGATION       OB History   No obstetric history on file.     History reviewed. No pertinent family history.  Social History   Tobacco Use   Smoking status: Never Smoker   Smokeless tobacco: Never Used  Substance Use Topics   Alcohol use: No   Drug use: No    Home Medications Prior to  Admission medications   Medication Sig Start Date End Date Taking? Authorizing Provider  cyclobenzaprine (FLEXERIL) 10 MG tablet Take 0.5-1 tablets (5-10 mg total) by mouth 3 (three) times daily as needed for muscle spasms. Patient not taking: Reported on 11/23/2019 10/06/15   Leone Brand, MD    Allergies    Patient has no known allergies.  Review of Systems   Review of Systems  Constitutional: Positive for fatigue.  Cardiovascular: Positive for chest pain.  All other systems reviewed and are negative.   Physical Exam Updated Vital Signs BP 123/80    Pulse 86    Temp 98.1 F (36.7 C) (Oral)    Resp 23    Ht 4' 11.5" (1.511 m)    Wt 60.8 kg    SpO2 98%    BMI 26.61 kg/m   Physical Exam Vitals and nursing note reviewed.  Constitutional:      General: She is not in acute distress.    Appearance: Normal appearance. She is well-developed.  HENT:     Head: Normocephalic and atraumatic.     Right Ear: Hearing normal.     Left Ear: Hearing normal.     Nose: Nose normal.  Eyes:     Conjunctiva/sclera: Conjunctivae normal.     Pupils: Pupils are equal, round, and reactive to light.  Cardiovascular:     Rate and Rhythm: Regular rhythm.     Heart sounds:  S1 normal and S2 normal. No murmur heard.  No friction rub. No gallop.   Pulmonary:     Effort: Pulmonary effort is normal. No respiratory distress.     Breath sounds: Normal breath sounds.  Chest:     Chest wall: No tenderness.  Abdominal:     General: Bowel sounds are normal.     Palpations: Abdomen is soft.     Tenderness: There is no abdominal tenderness. There is no guarding or rebound. Negative signs include Murphy's sign and McBurney's sign.     Hernia: No hernia is present.  Musculoskeletal:        General: Normal range of motion.     Cervical back: Normal range of motion and neck supple.  Skin:    General: Skin is warm and dry.     Findings: No rash.  Neurological:     Mental Status: She is alert and oriented to  person, place, and time.     GCS: GCS eye subscore is 4. GCS verbal subscore is 5. GCS motor subscore is 6.     Cranial Nerves: No cranial nerve deficit.     Sensory: No sensory deficit.     Coordination: Coordination normal.  Psychiatric:        Speech: Speech normal.        Behavior: Behavior normal.        Thought Content: Thought content normal.     ED Results / Procedures / Treatments   Labs (all labs ordered are listed, but only abnormal results are displayed) Labs Reviewed  BASIC METABOLIC PANEL - Abnormal; Notable for the following components:      Result Value   Potassium 3.4 (*)    Glucose, Bld 106 (*)    All other components within normal limits  CBC - Abnormal; Notable for the following components:   RBC 3.21 (*)    Hemoglobin 4.6 (*)    HCT 19.3 (*)    MCV 60.1 (*)    MCH 14.3 (*)    MCHC 23.8 (*)    RDW 24.9 (*)    nRBC 0.8 (*)    All other components within normal limits  PREPARE RBC (CROSSMATCH)  TYPE AND SCREEN  TROPONIN I (HIGH SENSITIVITY)  TROPONIN I (HIGH SENSITIVITY)    EKG EKG Interpretation  Date/Time:  Sunday November 23 2019 04:26:00 EDT Ventricular Rate:  98 PR Interval:  130 QRS Duration: 68 QT Interval:  342 QTC Calculation: 436 R Axis:   56 Text Interpretation: Normal sinus rhythm Low voltage QRS Septal infarct , age undetermined Abnormal ECG Confirmed by Orpah Greek [phone]) on 11/23/2019 6:03:09 AM   Radiology DG Chest 2 View  Result Date: 11/23/2019 CLINICAL DATA:  Chest pain EXAM: CHEST - 2 VIEW COMPARISON:  None FINDINGS: The heart size and mediastinal contours are within normal limits. Both lungs are clear. Pectus deformity. Mild scoliosis. No acute osseous abnormality. IMPRESSION: No active cardiopulmonary disease. Electronically Signed   By: Kerby Moors M.D.   On: 11/23/2019 05:07    Procedures Procedures (including critical care time)  Medications Ordered in ED Medications  sodium chloride flush (NS) 0.9 %  injection 3 mL (has no administration in time range)  0.9 %  sodium chloride infusion (has no administration in time range)    ED Course  I have reviewed the triage vital signs and the nursing notes.  Pertinent labs & imaging results that were available during my care of the patient were reviewed by  me and considered in my medical decision making (see chart for details).    MDM Rules/Calculators/A&P HEAR Score: 1                        Patient with nonexertional chest pain that has been present for the entire day.  This is atypical for cardiac chest pain although she is found to be profoundly anemic.  Patient is known to have chronic anemia with a hemoglobin normally around 6.  Patient's hemoglobin today is 4.6.  Patient is not currently menstruating or bleeding.  She will be transfused 2 units of packed red blood cells.  Patient is under the care of a OB/GYN doctor.  She has been recommended to have a uterine ablation.  She will need to follow-up with her OB/GYN for further management of her menorrhagia.  EKG does not show ischemia or infarct.  Troponin is negative.  Does not require hospitalization for further chest pain work-up at this time as she is low risk. Final Clinical Impression(s) / ED Diagnoses Final diagnoses:  Iron deficiency anemia due to chronic blood loss  Menorrhagia with irregular cycle    Rx / DC Orders ED Discharge Orders    None       Orpah Greek, MD 11/23/19 [phone]

## 2019-11-23 NOTE — ED Provider Notes (Signed)
I received pt in signout from Dr. Betsey Holiday.  She had presented with ongoing problems with anemia in the known setting of heavy abnormal vaginal bleeding.  Dr. Betsey Holiday had ordered 2u PRBC and his plan was to d/c after transfusions complete.  Pt received blood transfusions, comfortable with normal HR and BP on reassessment.  She plans to follow closely with OB/GYN.  She states that because of history of lap band, she cannot tolerate iron pills but takes some other supplement to help with iron deficiency.  I encouraged her to follow closely with OB/GYN as she may need iron transfusions and does need definitive management of her abnormal vaginal bleeding.  I have reviewed return precautions and she voiced understanding.   Jood Retana, Wenda Overland, MD 11/23/19 [phone]

## 2019-11-23 NOTE — ED Notes (Signed)
Notified by blood bank, blood is ready

## 2019-11-23 NOTE — Discharge Instructions (Signed)
Schedule follow-up with your OB/GYN doctor to be reevaluated for your heavy vaginal bleeding.

## 2019-11-23 NOTE — ED Triage Notes (Signed)
Patient arrives to ED with complaints of left sided chest pain that radiates to her left arm. The CP woke her up from sleep this morning. The CP is described as sharp and constant. Patient states she's been fatigued this week.

## 2019-11-24 LAB — TYPE AND SCREEN
ABO/RH(D): A POS
Antibody Screen: NEGATIVE
Unit division: 0
Unit division: 0

## 2019-11-24 LAB — BPAM RBC
Blood Product Expiration Date: 202108192359
Blood Product Expiration Date: 202108192359
ISSUE DATE / TIME: 202107250901
ISSUE DATE / TIME: 202107251218
Unit Type and Rh: 6200
Unit Type and Rh: 6200

## 2019-12-18 ENCOUNTER — Ambulatory Visit (HOSPITAL_COMMUNITY)
Admission: RE | Admit: 2019-12-18 | Discharge: 2019-12-18 | Disposition: A | Discharge status: Home / Self Care | Payer: 59 | Source: Ambulatory Visit | Attending: Internal Medicine | Admitting: Internal Medicine

## 2019-12-18 ENCOUNTER — Other Ambulatory Visit: Payer: Self-pay

## 2019-12-18 DIAGNOSIS — D509 Iron deficiency anemia, unspecified: Secondary | ICD-10-CM | POA: Insufficient documentation

## 2019-12-18 MED ORDER — SODIUM CHLORIDE 0.9 % IV SOLN
750.0000 mg | Freq: Once | INTRAVENOUS | Status: AC
  Administered 2019-12-18: 750 mg via INTRAVENOUS
  Filled 2019-12-18: qty 15

## 2019-12-18 MED ORDER — SODIUM CHLORIDE 0.9 % IV SOLN
INTRAVENOUS | Status: DC | PRN
  Administered 2019-12-18: 250 mL via INTRAVENOUS

## 2019-12-18 NOTE — Progress Notes (Addendum)
PATIENT CARE CENTER NOTE  Diagnosis: Anemia    Provider: Christophe Louis, MD   Procedure: Injectafer 750 mg infusion    Note: Patient arrived for iron infusion. [facility] notified that patient needed order for iron. Spoke with Varney Biles, RN. Verbal order received for Injectafer infusion x 1. Patient received infusion via PIV. Infusion rated decreased for patient comfort but patient reported no adverse reaction related to infusion. Observed patient for 30 minutes post-infusion. Vital signs stable. Discharge instructions given. Patient alert, oriented and ambulatory at discharge.

## 2019-12-18 NOTE — Discharge Instructions (Signed)

## 2020-03-24 NOTE — Patient Instructions (Addendum)
DUE TO COVID-19 ONLY ONE VISITOR IS ALLOWED IN WAITING ROOM (VISITOR WILL HAVE A TEMPERATURE CHECK ON ARRIVAL AND MUST WEAR A FACE MASK THE ENTIRE TIME.)  ONCE YOU ARE ADMITTED TO YOUR PRIVATE ROOM, THE SAME ONE VISITOR IS ALLOWED TO VISIT DURING VISITING HOURS ONLY.  Your COVID swab testing is scheduled for: 04/03/20 at  12:05 pm , You must self quarantine after your testing per handout given to you at the testing site. [place] [address] [place], [place] [address]  (Must self quarantine after testing. Follow instructions on handout.)       Your procedure is scheduled on:04/07/20  Report to [facility] AT: 6:30  A. M.   Call this number if you have problems the morning of [phone].   OUR ADDRESS IS [address].  WE ARE LOCATED IN THE [place]                                   [facility].                                     REMEMBER:   DO NOT EAT FOOD OR DRINK LIQUIDS AFTER MIDNIGHT .    BRUSH YOUR TEETH THE MORNING OF SURGERY.    DO NOT WEAR JEWERLY, MAKE UP, OR NAIL POLISH.  DO NOT WEAR LOTIONS, POWDERS, PERFUMES/COLOGNE OR DEODORANT.  DO NOT SHAVE FOR 24 HOURS PRIOR TO DAY OF SURGERY.  CONTACTS, GLASSES, OR DENTURES MAY NOT BE WORN TO SURGERY.                                    [facility] IS NOT RESPONSIBLE  FOR ANY BELONGINGS.         YOU MAY BRING A SMALL OVERNIGHT [facility] - Preparing for Surgery Before surgery, you can play an important role.  Because skin is not sterile, your skin needs to be as free of germs as possible.  You can reduce the number of germs on your skin by washing with CHG (chlorahexidine gluconate) soap before surgery.  CHG is an antiseptic cleaner which kills germs and bonds with the skin to continue killing germs even after washing. Please DO NOT use if you have an allergy to CHG or antibacterial soaps.  If your skin becomes reddened/irritated stop using  the CHG and inform your nurse when you arrive at Short Stay. Do not shave (including legs and underarms) for at least 48 hours prior to the first CHG shower.  You may shave your face/neck. Please follow these instructions carefully:  1.  Shower with CHG Soap the night before surgery and the  morning of Surgery.  2.  If you choose to wash your hair, wash your hair first as usual with your  normal  shampoo.  3.  After you shampoo, rinse your hair and body thoroughly to remove the  shampoo.  4.  Use CHG as you would any other liquid soap.  You can apply chg directly  to the skin and wash                       Gently with a scrungie or clean washcloth.  5.  Apply the CHG Soap to your body ONLY FROM THE NECK DOWN.   Do not use on face/ open                           Wound or open sores. Avoid contact with eyes, ears mouth and genitals (private parts).                       Wash face,  Genitals (private parts) with your normal soap.             6.  Wash thoroughly, paying special attention to the area where your surgery  will be performed.  7.  Thoroughly rinse your body with warm water from the neck down.  8.  DO NOT shower/wash with your normal soap after using and rinsing off  the CHG Soap.                9.  Pat yourself dry with a clean towel.            10.  Wear clean pajamas.            11.  Place clean sheets on your bed the night of your first shower and do not  sleep with pets. Day of Surgery : Do not apply any lotions/deodorants the morning of surgery.  Please wear clean clothes to the hospital/surgery center.  FAILURE TO FOLLOW THESE INSTRUCTIONS MAY RESULT IN THE CANCELLATION OF YOUR SURGERY PATIENT SIGNATURE_________________________________  NURSE SIGNATURE__________________________________  ________________________________________________________________________

## 2020-03-24 NOTE — Progress Notes (Signed)
Pt. Needs orders for upcomming surgery.PST and labs appointment on: 03/29/20.Thanks.

## 2020-03-29 ENCOUNTER — Encounter (HOSPITAL_COMMUNITY): Payer: Self-pay

## 2020-03-29 ENCOUNTER — Encounter (HOSPITAL_COMMUNITY)
Admission: RE | Admit: 2020-03-29 | Discharge: 2020-03-29 | Disposition: A | Discharge status: Home / Self Care | Payer: 59 | Source: Ambulatory Visit | Attending: Obstetrics and Gynecology | Admitting: Obstetrics and Gynecology

## 2020-03-29 ENCOUNTER — Other Ambulatory Visit: Payer: Self-pay

## 2020-03-29 DIAGNOSIS — Z01818 Encounter for other preprocedural examination: Secondary | ICD-10-CM | POA: Insufficient documentation

## 2020-03-29 HISTORY — DX: Other complications of anesthesia, initial encounter: T88.59XA

## 2020-03-29 HISTORY — DX: Cardiac murmur, unspecified: R01.1

## 2020-03-29 LAB — CBC
HCT: 28.3 % — ABNORMAL LOW (ref 36.0–46.0)
Hemoglobin: 8.6 g/dL — ABNORMAL LOW (ref 12.0–15.0)
MCH: 24.2 pg — ABNORMAL LOW (ref 26.0–34.0)
MCHC: 30.4 g/dL (ref 30.0–36.0)
MCV: 79.7 fL — ABNORMAL LOW (ref 80.0–100.0)
Platelets: 234 10*3/uL (ref 150–400)
RBC: 3.55 MIL/uL — ABNORMAL LOW (ref 3.87–5.11)
RDW: 17.1 % — ABNORMAL HIGH (ref 11.5–15.5)
WBC: 4.1 10*3/uL (ref 4.0–10.5)
nRBC: 0 % (ref 0.0–0.2)

## 2020-03-29 NOTE — Progress Notes (Signed)
COVID Vaccine Completed: Yes Date COVID Vaccine completed: 07/15/19 COVID vaccine manufacturer: [place]      PCP - No PCP Cardiologist -   Chest x-ray -  EKG - 11/24/19 Stress Test -  ECHO -  Cardiac Cath -  Pacemaker/ICD device last checked:  Sleep Study -  CPAP -   Fasting Blood Sugar -  Checks Blood Sugar _____ times a day  Blood Thinner Instructions: Aspirin Instructions: Last Dose:  Anesthesia review: Hx: Heart murmur  Patient denies shortness of breath, fever, cough and chest pain at PAT appointment   Patient verbalized understanding of instructions that were given to them at the PAT appointment. Patient was also instructed that they will need to review over the PAT instructions again at home before surgery.

## 2020-03-30 NOTE — Progress Notes (Signed)
Lab results: Hemoglobin: 8.6 HCT: 28.3

## 2020-04-03 ENCOUNTER — Other Ambulatory Visit (HOSPITAL_COMMUNITY)
Admission: RE | Admit: 2020-04-03 | Discharge: 2020-04-03 | Disposition: A | Discharge status: Home / Self Care | Payer: 59 | Source: Ambulatory Visit | Attending: Obstetrics and Gynecology | Admitting: Obstetrics and Gynecology

## 2020-04-03 DIAGNOSIS — Z01812 Encounter for preprocedural laboratory examination: Secondary | ICD-10-CM | POA: Insufficient documentation

## 2020-04-03 DIAGNOSIS — Z20822 Contact with and (suspected) exposure to covid-19: Secondary | ICD-10-CM | POA: Insufficient documentation

## 2020-04-03 LAB — SARS CORONAVIRUS 2 (TAT 6-24 HRS): SARS Coronavirus 2: NEGATIVE

## 2020-04-04 ENCOUNTER — Other Ambulatory Visit: Payer: Self-pay | Admitting: Obstetrics and Gynecology

## 2020-04-04 NOTE — H&P (Signed)
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 Does patient report new onset of COVID symptoms? No. Has patient or close contact tested positive for COVID-19? No , not in the past 2 weeks.  General:          49 yo presents for pre-op visit.        Pt is scheduled for robotic assisted laparoscopic hysterectomy w/ bilateral salpingectomy on Apr 07, 2020 for management of menorrhagia and fibroids.        Pt seen Dec 02, 2019 c/o abnormal periods: one for 2-3 days on September 30, 2019 and then another on October 12, 2019. She reported changing her overnight heavy pad every 2 hrs on her heaviest day. Endorsed hygiene accidents and difficulty losing weight, though exercising regularly. H/o lap band surgery. Denied h/o MI, breast cancer, stroke, or DVT. She was prescribed Lysteda 650 MG for menorrhagia.        She also reported she went to the hospital for intermittent pelvic pain and nausea that lasted for 2 days. HGB was 4.0 at hospital. She received 2 units of blood. She tried supplements in the past, but was unable to tolerate them. Labs on Aug 3rd revealed [facility] and TSH WNL, HGB low at 8.0, though improved from hospital visit. Iron level low at 12.         She received Injectafer iron transfusion on Dec 18, 2019 at [facility]        U/S performed Sept 13, 2021 revealed uterus measuring 11.2 x 8.3 x 9.9 cm. Endometrium thickened at 9.3 mm. Multiple fibroids noted. 8 were measured w/ the largest located anterior and measuring 4.0 cm. There was a posterior midbody submucosal fibroid pushing majority into endometrial cavity. Bilat OV WNL. No adnexal masses were seen.         EMB performed Sept 13, 2021 revealed mixed phase endometrium with extensive breakdown. No atypia or carcinoma identified.        Labs on Sept 13, 2021 revealed HGB 11.1, low, though almost  normal. Iron low at 36, though improved. Pt advised to continue taking iron supplements as tolerated.        Pelvic exam revealed 12 wk size uterus.     Current Medications  Taking  .Iron 240 (27 Fe) MG Tablet 1 tablet with water or juice between meals Orally Once a day    .Vitamin D3 25 MCG (1000 UT) Capsule 1 capsule Orally Once a day    Medication List reviewed and reconciled with the patient         Past Medical History        Anemia.        Surgical History         Tubal ligation 01/1992         Lap band 05/2005       Family History   Father: deceased, diagnosed with Lung Cancer   Mother: alive   1 son(s) , 1 daughter(s) .    denies any GYN family cancer hx.       Social History  General:   Tobacco use  cigarettes: Never smoked, Tobacco history last updated 03/23/2020, Vaping No. no Alcohol. Caffeine: yes, coffee, 2+ servings daily. no Recreational drug use. Marital Status: single. Children: 1 boy, 1 girl. 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      Gyn History  Sexual activity not currently sexually active.   Periods : irregular.   LMP 10/12/2019.   Birth control BTL.   Last pap smear date 3 yrs ago-per pt.   Last mammogram date 06/2019-per pt ([facility]).   Denies H/O Abnormal pap smear.       OB History  Number of pregnancies 2.   Pregnancy # 1 live birth, vaginal delivery, girl.   Pregnancy # 2 live birth, vaginal delivery, boy.       Allergies   N.K.D.A.       Hospitalization/Major Diagnostic Procedure   anemia 11/23/2019       Review of Systems  CONSTITUTIONAL:         Chills No. Fatigue No. Fever No. Night sweats No. Recent travel outside [place] No. Sweats No. Weight change YES.     OPHTHALMOLOGY:         Blurring of vision no. Change in vision no. Double vision no.     ENT:         Dizziness no. Nose bleeds no. Sore throat no. Teeth pain no.     ALLERGY:         Hives no.     CARDIOLOGY:          Chest pain no. High blood pressure no. Irregular heart beat no. Leg edema no. Palpitations no.     RESPIRATORY:         Shortness of breath no. Cough no. Wheezing no.     UROLOGY:         Pain with urination no. Urinary urgency no. Urinary frequency no. Urinary incontinence no. Difficulty urinating No. Blood in urine No.     GASTROENTEROLOGY:         Abdominal pain no. Appetite change no. Bloating/belching no. Blood in stool or on toilet paper no. Change in bowel movements no. Constipation no. Diarrhea no. Difficulty swallowing no. Nausea no.     FEMALE REPRODUCTIVE:         Vulvar pain no. Vulvar rash no. Abnormal vaginal bleeding menorrhagia. Breast pain no. Nipple discharge no. Pain with intercourse no. Pelvic pain no. Unusual vaginal discharge no. Vaginal itching no.     MUSCULOSKELETAL:         Muscle aches no.     NEUROLOGY:         Headache no. Tingling/numbness no. Weakness no.     PSYCHOLOGY:         Depression no. Anxiety no. Nervousness no. Sleep disturbances no. Suicidal ideation no .     ENDOCRINOLOGY:         Excessive thirst no. Excessive urination no. Hair loss no. Heat or cold intolerance no.     HEMATOLOGY/LYMPH:         Abnormal bleeding no. Easy bruising no. Swollen glands no.     DERMATOLOGY:         New/changing skin lesion no. Rash no. Sores no.     Negative except as stated in HPI.       Vital Signs  Wt 145.2, Wt change 5.8 lb, Ht 59.5, BMI 28.83, Pulse sitting 92, BP sitting 142/92.     Examination  General Examination:       CONSTITUTIONAL: alert, oriented, NAD . SKIN: moist, warm. EYES: Conjunctiva clear. LUNGS: good I:E efffort noted, CTA bilat. HEART: RRR. ABDOMEN: soft, non-tender/non-distended, bowel sounds present . FEMALE GENITOURINARY: normal external  genitalia, labia - unremarkable, vagina - pink moist mucosa, no lesions or abnormal discharge, cervix - no discharge or lesions or CMT, adnexa - no masses or tenderness, uterus - nontender, 12 wk size  uterus on exam. PSYCH: affect normal, good eye contact.       Physical Examination     Pt aware of scribe services today.     Assessments     1. Menorrhagia with irregular cycle - N92.1 (Primary)   2. Fibroids - D25.9   3. History of gastric surgery - Z98.890     Treatment   1. Menorrhagia with irregular cycle   Notes: Pt is scheduled for robotic assisted laparoscopic hysterectomy w/ bilateral salpingectomy on Apr 07, 2020 for management of menorrhagia and fibroids. Pt desires to keep her ovaries. Pt advised she will stay overnight, or 2 days if conversion to larger incision. She is advised that in order to be discharged from hospital, she will need to be able to ambulate, urinate, flatulate, tolerate food by mouth, and take pain medication by mouth. Discussed risks of hysterectomy including but not limited to infection, bleeding, conversion to larger incision, damage to her bowel, bladder, or ureters, with the need for further surgery. Discussed risk of blood transfusion and risk of HIV or hep B&C (1 out of 2 million and 1 out of 200,000, respectively) with blood transfusion. Pt is aware of risks and desires blood transfusion if needed. Pt advised to avoid NSAIDs (Aspirin, Aleve, Advil, Ibuprofen, Motrin) from now until surgery given risk of bleeding during surgery. She may take Tylenol for pain management. She is advised to avoid eating or drinking starting midnight prior to surgery. Discussed post-surgery avoidance of driving for 1 week and avoidance of lifting weight greater than 10 lbs or intercourse for 6-8 weeks after procedure. Follow up in 4 weeks for 2 wk post-op visit.      2. Fibroids   Notes: Pt is scheduled for robotic assisted laparoscopic hysterectomy w/ bilateral salpingectomy on Apr 07, 2020 for management of menorrhagia and fibroids. Follow up in 4 weeks for 2 wk post-op visit.      3. History of gastric surgery   Notes: Pt is s/p lap band surgery.

## 2020-04-04 NOTE — H&P (Deleted)
  The note originally documented on this encounter has been moved the the encounter in which it belongs.  

## 2020-04-07 ENCOUNTER — Encounter (HOSPITAL_BASED_OUTPATIENT_CLINIC_OR_DEPARTMENT_OTHER)
Admission: RE | Disposition: A | Discharge status: Home / Self Care | Payer: Self-pay | Source: Ambulatory Visit | Attending: Obstetrics and Gynecology

## 2020-04-07 ENCOUNTER — Ambulatory Visit (HOSPITAL_BASED_OUTPATIENT_CLINIC_OR_DEPARTMENT_OTHER): Payer: 59 | Admitting: Certified Registered"

## 2020-04-07 ENCOUNTER — Other Ambulatory Visit: Payer: Self-pay

## 2020-04-07 ENCOUNTER — Observation Stay (HOSPITAL_BASED_OUTPATIENT_CLINIC_OR_DEPARTMENT_OTHER)
Admission: RE | Admit: 2020-04-07 | Discharge: 2020-04-08 | Disposition: A | Discharge status: Home / Self Care | Payer: 59 | Source: Ambulatory Visit | Attending: Obstetrics and Gynecology | Admitting: Obstetrics and Gynecology

## 2020-04-07 ENCOUNTER — Encounter (HOSPITAL_BASED_OUTPATIENT_CLINIC_OR_DEPARTMENT_OTHER): Payer: Self-pay | Admitting: Obstetrics and Gynecology

## 2020-04-07 DIAGNOSIS — N840 Polyp of corpus uteri: Secondary | ICD-10-CM | POA: Diagnosis not present

## 2020-04-07 DIAGNOSIS — D259 Leiomyoma of uterus, unspecified: Secondary | ICD-10-CM | POA: Diagnosis not present

## 2020-04-07 DIAGNOSIS — D509 Iron deficiency anemia, unspecified: Secondary | ICD-10-CM | POA: Diagnosis not present

## 2020-04-07 DIAGNOSIS — N838 Other noninflammatory disorders of ovary, fallopian tube and broad ligament: Secondary | ICD-10-CM | POA: Diagnosis not present

## 2020-04-07 DIAGNOSIS — N800 Endometriosis of uterus: Secondary | ICD-10-CM | POA: Insufficient documentation

## 2020-04-07 DIAGNOSIS — N888 Other specified noninflammatory disorders of cervix uteri: Secondary | ICD-10-CM | POA: Diagnosis not present

## 2020-04-07 DIAGNOSIS — Z90710 Acquired absence of both cervix and uterus: Secondary | ICD-10-CM | POA: Diagnosis present

## 2020-04-07 DIAGNOSIS — Z9851 Tubal ligation status: Secondary | ICD-10-CM | POA: Insufficient documentation

## 2020-04-07 HISTORY — PX: ROBOTIC ASSISTED TOTAL HYSTERECTOMY WITH BILATERAL SALPINGO OOPHERECTOMY: SHX6086

## 2020-04-07 LAB — CBC
HCT: 28.7 % — ABNORMAL LOW (ref 36.0–46.0)
Hemoglobin: 8.5 g/dL — ABNORMAL LOW (ref 12.0–15.0)
MCH: 23.2 pg — ABNORMAL LOW (ref 26.0–34.0)
MCHC: 29.6 g/dL — ABNORMAL LOW (ref 30.0–36.0)
MCV: 78.2 fL — ABNORMAL LOW (ref 80.0–100.0)
Platelets: 304 10*3/uL (ref 150–400)
RBC: 3.67 MIL/uL — ABNORMAL LOW (ref 3.87–5.11)
RDW: 16.4 % — ABNORMAL HIGH (ref 11.5–15.5)
WBC: 4 10*3/uL (ref 4.0–10.5)
nRBC: 0 % (ref 0.0–0.2)

## 2020-04-07 LAB — TYPE AND SCREEN
ABO/RH(D): A POS
Antibody Screen: NEGATIVE

## 2020-04-07 LAB — POCT PREGNANCY, URINE: Preg Test, Ur: NEGATIVE

## 2020-04-07 SURGERY — HYSTERECTOMY, TOTAL, ROBOT-ASSISTED, LAPAROSCOPIC, WITH BILATERAL SALPINGO-OOPHORECTOMY
Anesthesia: General | Laterality: Bilateral

## 2020-04-07 MED ORDER — ONDANSETRON HCL 4 MG/2ML IJ SOLN: 4.0000 mg | Freq: Once | INTRAMUSCULAR | Status: DC | PRN

## 2020-04-07 MED ORDER — ACETAMINOPHEN 500 MG PO TABS
ORAL_TABLET | ORAL | Status: AC
  Filled 2020-04-07: qty 2

## 2020-04-07 MED ORDER — CELECOXIB 200 MG PO CAPS
400.0000 mg | ORAL_CAPSULE | ORAL | Status: AC
  Administered 2020-04-07: 400 mg via ORAL

## 2020-04-07 MED ORDER — PROPOFOL 10 MG/ML IV BOLUS
INTRAVENOUS | Status: AC
  Filled 2020-04-07: qty 20

## 2020-04-07 MED ORDER — DEXAMETHASONE SODIUM PHOSPHATE 10 MG/ML IJ SOLN
INTRAMUSCULAR | Status: DC | PRN
  Administered 2020-04-07: 10 mg via INTRAVENOUS

## 2020-04-07 MED ORDER — OXYCODONE HCL 5 MG PO TABS
ORAL_TABLET | ORAL | Status: AC
  Filled 2020-04-07: qty 1

## 2020-04-07 MED ORDER — SODIUM CHLORIDE 0.9 % IV SOLN
2.0000 g | INTRAVENOUS | Status: AC
  Administered 2020-04-07: 2 g via INTRAVENOUS

## 2020-04-07 MED ORDER — FENTANYL CITRATE (PF) 100 MCG/2ML IJ SOLN
INTRAMUSCULAR | Status: DC | PRN
  Administered 2020-04-07: 50 ug via INTRAVENOUS
  Administered 2020-04-07: 100 ug via INTRAVENOUS
  Administered 2020-04-07 (×2): 50 ug via INTRAVENOUS

## 2020-04-07 MED ORDER — KETOROLAC TROMETHAMINE 30 MG/ML IJ SOLN
INTRAMUSCULAR | Status: AC
  Filled 2020-04-07: qty 1

## 2020-04-07 MED ORDER — IBUPROFEN 800 MG PO TABS: 800.0000 mg | ORAL_TABLET | Freq: Three times a day (TID) | ORAL | Status: DC

## 2020-04-07 MED ORDER — DEXAMETHASONE SODIUM PHOSPHATE 10 MG/ML IJ SOLN
INTRAMUSCULAR | Status: AC
  Filled 2020-04-07: qty 1

## 2020-04-07 MED ORDER — ACETAMINOPHEN 500 MG PO TABS
1000.0000 mg | ORAL_TABLET | Freq: Four times a day (QID) | ORAL | Status: DC
  Administered 2020-04-07 (×2): 1000 mg via ORAL

## 2020-04-07 MED ORDER — MIDAZOLAM HCL 5 MG/5ML IJ SOLN
INTRAMUSCULAR | Status: DC | PRN
  Administered 2020-04-07: 2 mg via INTRAVENOUS

## 2020-04-07 MED ORDER — LIDOCAINE 2% (20 MG/ML) 5 ML SYRINGE
INTRAMUSCULAR | Status: DC | PRN
  Administered 2020-04-07: 80 mg via INTRAVENOUS

## 2020-04-07 MED ORDER — KETOROLAC TROMETHAMINE 30 MG/ML IJ SOLN
30.0000 mg | Freq: Once | INTRAMUSCULAR | Status: AC
  Administered 2020-04-07: 30 mg via INTRAVENOUS

## 2020-04-07 MED ORDER — SODIUM CHLORIDE 0.9 % IV SOLN
INTRAVENOUS | Status: DC | PRN
  Administered 2020-04-07: 120 mL

## 2020-04-07 MED ORDER — SIMETHICONE 80 MG PO CHEW
80.0000 mg | CHEWABLE_TABLET | Freq: Four times a day (QID) | ORAL | Status: DC | PRN
  Administered 2020-04-07: 80 mg via ORAL

## 2020-04-07 MED ORDER — LIDOCAINE HCL 2 % IJ SOLN
INTRAMUSCULAR | Status: AC
  Filled 2020-04-07: qty 20

## 2020-04-07 MED ORDER — GABAPENTIN 300 MG PO CAPS
300.0000 mg | ORAL_CAPSULE | ORAL | Status: AC
  Administered 2020-04-07: 300 mg via ORAL

## 2020-04-07 MED ORDER — GABAPENTIN 300 MG PO CAPS
ORAL_CAPSULE | ORAL | Status: AC
  Filled 2020-04-07: qty 1

## 2020-04-07 MED ORDER — ACETAMINOPHEN 500 MG PO TABS
1000.0000 mg | ORAL_TABLET | ORAL | Status: AC
  Administered 2020-04-07: 1000 mg via ORAL

## 2020-04-07 MED ORDER — ACETAMINOPHEN 500 MG PO TABS
ORAL_TABLET | ORAL | Status: AC
  Filled 2020-04-07: qty 1

## 2020-04-07 MED ORDER — SODIUM CHLORIDE 0.9 % IR SOLN
Status: DC | PRN
  Administered 2020-04-07: 3000 mL

## 2020-04-07 MED ORDER — ONDANSETRON HCL 4 MG/2ML IJ SOLN
INTRAMUSCULAR | Status: DC | PRN
  Administered 2020-04-07: 4 mg via INTRAVENOUS

## 2020-04-07 MED ORDER — ONDANSETRON HCL 4 MG PO TABS: 4.0000 mg | ORAL_TABLET | Freq: Four times a day (QID) | ORAL | Status: DC | PRN

## 2020-04-07 MED ORDER — AMISULPRIDE (ANTIEMETIC) 5 MG/2ML IV SOLN
10.0000 mg | Freq: Once | INTRAVENOUS | Status: AC | PRN
  Administered 2020-04-07: 10 mg via INTRAVENOUS

## 2020-04-07 MED ORDER — FENTANYL CITRATE (PF) 250 MCG/5ML IJ SOLN
INTRAMUSCULAR | Status: AC
  Filled 2020-04-07: qty 5

## 2020-04-07 MED ORDER — LACTATED RINGERS IV SOLN: INTRAVENOUS | Status: DC

## 2020-04-07 MED ORDER — MENTHOL 3 MG MT LOZG: 1.0000 | LOZENGE | OROMUCOSAL | Status: DC | PRN

## 2020-04-07 MED ORDER — SIMETHICONE 80 MG PO CHEW
CHEWABLE_TABLET | ORAL | Status: AC
  Filled 2020-04-07: qty 1

## 2020-04-07 MED ORDER — ORAL CARE MOUTH RINSE: 15.0000 mL | Freq: Once | OROMUCOSAL | Status: DC

## 2020-04-07 MED ORDER — ALUM & MAG HYDROXIDE-SIMETH 200-200-20 MG/5ML PO SUSP
30.0000 mL | ORAL | Status: DC | PRN
  Administered 2020-04-08: 30 mL via ORAL

## 2020-04-07 MED ORDER — HYDROMORPHONE HCL 1 MG/ML IJ SOLN: 0.2000 mg | INTRAMUSCULAR | Status: DC | PRN

## 2020-04-07 MED ORDER — IBUPROFEN 800 MG PO TABS
800.0000 mg | ORAL_TABLET | Freq: Three times a day (TID) | ORAL | 1 refills | Status: DC | PRN
Start: 2020-04-07 — End: 2023-11-20

## 2020-04-07 MED ORDER — PANTOPRAZOLE SODIUM 40 MG IV SOLR
40.0000 mg | Freq: Every day | INTRAVENOUS | Status: DC
  Administered 2020-04-07: 40 mg via INTRAVENOUS

## 2020-04-07 MED ORDER — CHLORHEXIDINE GLUCONATE 0.12 % MT SOLN: 15.0000 mL | Freq: Once | OROMUCOSAL | Status: DC

## 2020-04-07 MED ORDER — KETAMINE HCL 10 MG/ML IJ SOLN
INTRAMUSCULAR | Status: DC | PRN
  Administered 2020-04-07: 30 mg via INTRAVENOUS

## 2020-04-07 MED ORDER — ZOLPIDEM TARTRATE 5 MG PO TABS: 5.0000 mg | ORAL_TABLET | Freq: Every evening | ORAL | Status: DC | PRN

## 2020-04-07 MED ORDER — OXYCODONE HCL 5 MG PO TABS
5.0000 mg | ORAL_TABLET | ORAL | 0 refills | Status: AC | PRN
Start: 2020-04-07 — End: 2020-04-14

## 2020-04-07 MED ORDER — LACTATED RINGERS IV SOLN
INTRAVENOUS | Status: DC
  Administered 2020-04-07: 1000 mL via INTRAVENOUS

## 2020-04-07 MED ORDER — SENNA 8.6 MG PO TABS
1.0000 | ORAL_TABLET | Freq: Two times a day (BID) | ORAL | Status: DC
  Administered 2020-04-07: 8.6 mg via ORAL

## 2020-04-07 MED ORDER — ONDANSETRON HCL 4 MG/2ML IJ SOLN: 4.0000 mg | Freq: Four times a day (QID) | INTRAMUSCULAR | Status: DC | PRN

## 2020-04-07 MED ORDER — PANTOPRAZOLE SODIUM 40 MG IV SOLR
INTRAVENOUS | Status: AC
  Filled 2020-04-07: qty 40

## 2020-04-07 MED ORDER — FENTANYL CITRATE (PF) 100 MCG/2ML IJ SOLN
INTRAMUSCULAR | Status: AC
  Filled 2020-04-07: qty 2

## 2020-04-07 MED ORDER — OXYCODONE HCL 5 MG/5ML PO SOLN: 5.0000 mg | Freq: Once | ORAL | Status: AC | PRN

## 2020-04-07 MED ORDER — SENNA 8.6 MG PO TABS
ORAL_TABLET | ORAL | Status: AC
  Filled 2020-04-07: qty 1

## 2020-04-07 MED ORDER — OXYCODONE HCL 5 MG PO TABS
5.0000 mg | ORAL_TABLET | ORAL | Status: DC | PRN
  Administered 2020-04-07: 5 mg via ORAL

## 2020-04-07 MED ORDER — PHENYLEPHRINE 40 MCG/ML (10ML) SYRINGE FOR IV PUSH (FOR BLOOD PRESSURE SUPPORT)
PREFILLED_SYRINGE | INTRAVENOUS | Status: AC
  Filled 2020-04-07: qty 10

## 2020-04-07 MED ORDER — KETAMINE HCL 10 MG/ML IJ SOLN
INTRAMUSCULAR | Status: AC
  Filled 2020-04-07: qty 1

## 2020-04-07 MED ORDER — OXYCODONE HCL 5 MG PO TABS
5.0000 mg | ORAL_TABLET | Freq: Once | ORAL | Status: AC | PRN
  Administered 2020-04-07: 5 mg via ORAL

## 2020-04-07 MED ORDER — LIDOCAINE 20MG/ML (2%) 15 ML SYRINGE OPTIME
INTRAMUSCULAR | Status: DC | PRN
  Administered 2020-04-07: 1.5 mg/kg/h via INTRAVENOUS

## 2020-04-07 MED ORDER — MIDAZOLAM HCL 2 MG/2ML IJ SOLN
INTRAMUSCULAR | Status: AC
  Filled 2020-04-07: qty 2

## 2020-04-07 MED ORDER — ROCURONIUM BROMIDE 10 MG/ML (PF) SYRINGE
PREFILLED_SYRINGE | INTRAVENOUS | Status: DC | PRN
  Administered 2020-04-07: 60 mg via INTRAVENOUS

## 2020-04-07 MED ORDER — POVIDONE-IODINE 10 % EX SWAB
2.0000 "application " | Freq: Once | CUTANEOUS | Status: AC
  Administered 2020-04-07: 2 via TOPICAL

## 2020-04-07 MED ORDER — ONDANSETRON HCL 4 MG/2ML IJ SOLN
INTRAMUSCULAR | Status: AC
  Filled 2020-04-07: qty 2

## 2020-04-07 MED ORDER — PROPOFOL 10 MG/ML IV BOLUS
INTRAVENOUS | Status: DC | PRN
  Administered 2020-04-07: 160 mg via INTRAVENOUS

## 2020-04-07 MED ORDER — FENTANYL CITRATE (PF) 100 MCG/2ML IJ SOLN
25.0000 ug | INTRAMUSCULAR | Status: DC | PRN
  Administered 2020-04-07 (×3): 25 ug via INTRAVENOUS

## 2020-04-07 MED ORDER — ROCURONIUM BROMIDE 10 MG/ML (PF) SYRINGE
PREFILLED_SYRINGE | INTRAVENOUS | Status: AC
  Filled 2020-04-07: qty 10

## 2020-04-07 MED ORDER — AMISULPRIDE (ANTIEMETIC) 5 MG/2ML IV SOLN
INTRAVENOUS | Status: AC
  Filled 2020-04-07: qty 2

## 2020-04-07 MED ORDER — CELECOXIB 200 MG PO CAPS
ORAL_CAPSULE | ORAL | Status: AC
  Filled 2020-04-07: qty 2

## 2020-04-07 MED ORDER — SUGAMMADEX SODIUM 200 MG/2ML IV SOLN
INTRAVENOUS | Status: DC | PRN
  Administered 2020-04-07: 130 mg via INTRAVENOUS

## 2020-04-07 MED ORDER — SODIUM CHLORIDE 0.9 % IV SOLN
INTRAVENOUS | Status: AC
  Filled 2020-04-07: qty 2

## 2020-04-07 SURGICAL SUPPLY — 69 items
ADH SKN CLS APL DERMABOND .7 (GAUZE/BANDAGES/DRESSINGS) ×1
APL SRG 38 LTWT LNG FL B (MISCELLANEOUS) ×1
APPLICATOR ARISTA FLEXITIP XL (MISCELLANEOUS) ×3 IMPLANT
BARRIER ADHS 3X4 INTERCEED (GAUZE/BANDAGES/DRESSINGS) IMPLANT
BRR ADH 4X3 ABS CNTRL BYND (GAUZE/BANDAGES/DRESSINGS)
CATH FOLEY 3WAY  5CC 16FR (CATHETERS) ×2
CATH FOLEY 3WAY 5CC 16FR (CATHETERS) ×1 IMPLANT
CELLS DAT CNTRL 66122 CELL SVR (MISCELLANEOUS) ×1 IMPLANT
COVER BACK TABLE 60X90IN (DRAPES) ×3 IMPLANT
COVER TIP SHEARS 8 DVNC (MISCELLANEOUS) ×1 IMPLANT
COVER TIP SHEARS 8MM DA VINCI (MISCELLANEOUS) ×2
DECANTER SPIKE VIAL GLASS SM (MISCELLANEOUS) ×6 IMPLANT
DEFOGGER SCOPE WARMER CLEARIFY (MISCELLANEOUS) ×3 IMPLANT
DERMABOND ADVANCED (GAUZE/BANDAGES/DRESSINGS) ×2
DERMABOND ADVANCED .7 DNX12 (GAUZE/BANDAGES/DRESSINGS) ×1 IMPLANT
DILATOR CANAL MILEX (MISCELLANEOUS) ×3 IMPLANT
DRAPE ARM DVNC X/XI (DISPOSABLE) ×4 IMPLANT
DRAPE COLUMN DVNC XI (DISPOSABLE) ×1 IMPLANT
DRAPE DA VINCI XI ARM (DISPOSABLE) ×8
DRAPE DA VINCI XI COLUMN (DISPOSABLE) ×3
DRAPE UTILITY 15X26 TOWEL STRL (DRAPES) ×3 IMPLANT
DURAPREP 26ML APPLICATOR (WOUND CARE) ×3 IMPLANT
ELECT REM PT RETURN 9FT ADLT (ELECTROSURGICAL) ×3
ELECTRODE REM PT RTRN 9FT ADLT (ELECTROSURGICAL) ×1 IMPLANT
GLOVE BIOGEL M 7.0 STRL (GLOVE) ×9 IMPLANT
GLOVE BIOGEL PI IND STRL 7.0 (GLOVE) ×6 IMPLANT
GLOVE BIOGEL PI INDICATOR 7.0 (GLOVE) ×12
HEMOSTAT ARISTA ABSORB 3G PWDR (HEMOSTASIS) ×3 IMPLANT
HOLDER FOLEY CATH W/STRAP (MISCELLANEOUS) IMPLANT
IRRIG SUCT STRYKERFLOW 2 WTIP (MISCELLANEOUS) ×3
IRRIGATION SUCT STRKRFLW 2 WTP (MISCELLANEOUS) ×1 IMPLANT
KIT TURNOVER CYSTO (KITS) ×3 IMPLANT
LEGGING LITHOTOMY PAIR STRL (DRAPES) ×3 IMPLANT
OBTURATOR OPTICAL STANDARD 8MM (TROCAR)
OBTURATOR OPTICAL STND 8 DVNC (TROCAR)
OBTURATOR OPTICALSTD 8 DVNC (TROCAR) IMPLANT
OCCLUDER COLPOPNEUMO (BALLOONS) ×3 IMPLANT
PACK ROBOT WH (CUSTOM PROCEDURE TRAY) ×3 IMPLANT
PACK ROBOTIC GOWN (GOWN DISPOSABLE) ×3 IMPLANT
PACK TRENDGUARD 450 HYBRID PRO (MISCELLANEOUS) IMPLANT
PAD OB MATERNITY 4.3X12.25 (PERSONAL CARE ITEMS) ×3 IMPLANT
PAD PREP 24X48 CUFFED NSTRL (MISCELLANEOUS) ×3 IMPLANT
PROTECTOR NERVE ULNAR (MISCELLANEOUS) ×3 IMPLANT
RTRCTR WOUND ALEXIS 18CM MED (MISCELLANEOUS) ×3
RTRCTR WOUND ALEXIS 18CM SML (INSTRUMENTS)
SAVER CELL AAL HAEMONETICS (INSTRUMENTS) IMPLANT
SCISSORS LAP 5X35 DISP (ENDOMECHANICALS) ×3 IMPLANT
SCISSORS LAP 5X45 EPIX DISP (ENDOMECHANICALS) IMPLANT
SEAL CANN UNIV 5-8 DVNC XI (MISCELLANEOUS) ×4 IMPLANT
SEAL XI 5MM-8MM UNIVERSAL (MISCELLANEOUS) ×12
SEALER VESSEL DA VINCI XI (MISCELLANEOUS)
SEALER VESSEL EXT DVNC XI (MISCELLANEOUS) IMPLANT
SET IRRIG Y TYPE TUR BLADDER L (SET/KITS/TRAYS/PACK) IMPLANT
SET TRI-LUMEN FLTR TB AIRSEAL (TUBING) IMPLANT
SUT VIC AB 0 CT1 27 (SUTURE) ×6
SUT VIC AB 0 CT1 27XBRD ANBCTR (SUTURE) ×2 IMPLANT
SUT VICRYL 0 UR6 27IN ABS (SUTURE) IMPLANT
SUT VICRYL RAPIDE 4/0 PS 2 (SUTURE) ×9 IMPLANT
SUT VLOC 180 0 9IN  GS21 (SUTURE) ×2
SUT VLOC 180 0 9IN GS21 (SUTURE) ×1 IMPLANT
TIP RUMI ORANGE 6.7MMX12CM (TIP) IMPLANT
TIP UTERINE 5.1X6CM LAV DISP (MISCELLANEOUS) IMPLANT
TIP UTERINE 6.7X10CM GRN DISP (MISCELLANEOUS) IMPLANT
TIP UTERINE 6.7X6CM WHT DISP (MISCELLANEOUS) IMPLANT
TIP UTERINE 6.7X8CM BLUE DISP (MISCELLANEOUS) ×3 IMPLANT
TOWEL OR 17X26 10 PK STRL BLUE (TOWEL DISPOSABLE) ×3 IMPLANT
TRENDGUARD 450 HYBRID PRO PACK (MISCELLANEOUS)
TROCAR PORT AIRSEAL 8X120 (TROCAR) ×3 IMPLANT
WATER STERILE IRR 1000ML POUR (IV SOLUTION) ×3 IMPLANT

## 2020-04-07 NOTE — Op Note (Signed)
04/07/2020  12:18 PM  PATIENT:  Tenna Child  49 y.o. female  PRE-OPERATIVE DIAGNOSIS:  D25.9 Fibroids N92.1 menorrhagia with irregular cycle  POST-OPERATIVE DIAGNOSIS:  D25.9 Fibroids N92.1 menorrhagia with irregular cycle  PROCEDURE:  Procedure(s) with comments: XI ROBOTIC ASSISTED TOTAL HYSTERECTOMY WITH BILATERAL SALPINGECTOMY AND RIGHT OOPHECTOMY (Bilateral) - Tracie to RNFA confirmed on 02/25/20 CS  SURGEON:  Surgeon(s) and Role:    Christophe Louis, MD - Primary  PHYSICIAN ASSISTANT: Gaylord Shih RNFA  ASSISTANTS: none   ANESTHESIA:   general  EBL:  10 mL   BLOOD ADMINISTERED:none  DRAINS: Urinary Catheter (Foley)   LOCAL MEDICATIONS USED:  OTHER ropivacaine  SPECIMEN:  Source of Specimen:  Uterus cervix bilateral fallopian tubes and right ovary   DISPOSITION OF SPECIMEN:  PATHOLOGY  COUNTS:  YES  TOURNIQUET:  * No tourniquets in log *  DICTATION: .Dragon Dictation  PLAN OF CARE: Admit for overnight observation  PATIENT DISPOSITION:  PACU - hemodynamically stable.   Delay start of Pharmacological VTE agent (>24 hrs) due to surgical blood loss or risk of bleeding: not applicable  Specimen weight 310 grams   Findings: normal external genitalia normal appearing cervix .Marland Kitchen adhesions of the omentum to the anterior abdominal wall... tubing from gastric band noted.... enlarged fibroid uterus... normal appearing fallopian tubes and ovaries.   Procedure: The patient was taken to the operating room where she was placed under general anesthesia.Time out was performed. Marland Kitchen She was placed in dorsal lithotomy position and prepped and draped in the usual sterile fashion. A weighted speculum was placed into the vagina. A Deaver was placed anteriorly for retraction. The anterior lip of the cervix was grasped with a single-tooth tenaculum. The vaginal mucosa was injected with 2.5 cc of ropivacaine at the 2/4/ 8 and 10 o'clock positions. The uterus was sounded to 9 cm. the  cervix was dilated to 6 mm . 0 vicryl suture placed at the 12 and 6:00 positions Of the cervix to facilitate placement of a Ru mi uterine manipulator. The manipulator was placed without difficulty. Weighted speculum and Deaver were removed .  Attention was turned to the patient's abdomen where a 8 mm trocar was placed 2 cm above the umbilicus. under direct visualization . The pneumoperitoneum was achieved with PCO2 gas. The laparoscope was removed. 60 cc of ropivacaine were injected into the abdominal cavity. The laparoscope was reinserted. An 8 MM incision was made in the Right upper quadrant TROCAR WAS PLACED 8 cm from the umbilicus. Later connected to robotic arm #3. An 8 mm incision was made in the left upper quadrant 16 cm from the umbilicus and connected to robot arm #1. Marland Kitchen Attention was turned to the left upper quadrant where a 8 mm midclavicular assistant trocar was placed. ( All incision sites were injected with 10 cc of ropivacaine prior to port placement. )  Once all ports had been placed under direct visualization.The laparoscope was removed and the [place] robotic system was thin right-sided docked. The robotic arms were connected to the corresponding trocars as listed above. The laparoscope was then reinserted. The long tip bipolar forceps were placed into port #1. The vessel sealer was placed in port #3.  exchanged for robotic scissors when needed ) All instruments were directed into the pelvis under direct visualization.  Attention was turned to the surgeons console.. The left mesosalpinx and left utero-ovarian ligament was cauterized and transected with the vessel sealer The broad ligament was cauterized and transected with the  vessel sealer .The round ligament was cauterized and transected with the vessel sealer  The anterior leaf of broad ligament was incised along the bladder reflection to the midline.  The adhesions of the omentum were excised from the anterior abdominal wall using the  vessel sealer. The right  mesosalpinx and right utero-ovarian ligament was cauterized and transected with the vessel sealer.  The right infundibulopelvic ligament was compromised during cauterization  of the mesosalpinx. The Right infundibulopelvic ligament was therefor cauterized and transected.  The right broad ligament was cauterized and transected with the vessel sealer. The right round ligament was cauterized and transected with the vessel sealer The broad ligament was incised to the midline. The bladder was dissected off the lower uterine segments of the cervix via sharp and blunt dissection.   The uterine arteries were skeleton bilaterally. They were  cauterized and transected with the vessel sealer The KOH ring was identified. The anterior colpotomy was performed followed by the posterior colpotomy. Once the uterus,cervix and bilateral fallopian tubes were completely excised they were  removed through the vagina. To remove the specimen an alexis retractor was placed into the abdomen through the vagina. The uterus was cored and removed through the vagina.   Attention was then turned to the surgeons console...  The  bipolar forceps and scissors were removed and log tip forceps were placed in the port #1 and the cutting needle driver was placed in to port #3.  The vaginal cuff was closed with running suture if 0 v-lock. The pelvis was irrigated. Marland KitchenMarland KitchenMarland KitchenExcellent hemostasis was noted. Arista was placed along the vaginal cuff.  All pelvic pedicles were examined and hemostasis was noted.  All instruments removed from the ports. All ports were removed under direct Visualization. The pneumoperitoneum was released. The skin incisions were closed with 4-0 Vicryl and then covered with Derma bond.     Sponge lap and needle counts were correct x. The patient was awakened from anesthesia and taken to the recovery room in stable condition.

## 2020-04-07 NOTE — Anesthesia Postprocedure Evaluation (Signed)
Anesthesia Post Note  Patient: Tonya Barber  Procedure(s) Performed: XI ROBOTIC ASSISTED TOTAL HYSTERECTOMY WITH BILATERAL SALPINGECTOMY AND RIGHT OOPHECTOMY (Bilateral )     Patient location during evaluation: PACU Anesthesia Type: General Level of consciousness: awake and alert Pain management: pain level controlled Vital Signs Assessment: post-procedure vital signs reviewed and stable Respiratory status: spontaneous breathing, nonlabored ventilation and respiratory function stable Cardiovascular status: blood pressure returned to baseline and stable Postop Assessment: no apparent nausea or vomiting Anesthetic complications: no   No complications documented.  Last Vitals:  Vitals:   04/07/20 1415 04/07/20 1430  BP: (!) 150/85 (!) 141/79  Pulse: 77 65  Resp: 18 16  Temp:  36.4 C  SpO2: 100% 100%    Last Pain:  Vitals:   04/07/20 1430  TempSrc:   PainSc: 6                  Lidia Collum

## 2020-04-07 NOTE — Transfer of Care (Signed)
Immediate Anesthesia Transfer of Care Note  Patient: Tonya Barber  Procedure(s) Performed: XI ROBOTIC ASSISTED TOTAL HYSTERECTOMY WITH BILATERAL SALPINGECTOMY AND RIGHT OOPHECTOMY (Bilateral )  Patient Location: PACU  Anesthesia Type:General  Level of Consciousness: sedated  Airway & Oxygen Therapy: Patient Spontanous Breathing and Patient connected to nasal cannula oxygen  Post-op Assessment: Report given to RN  Post vital signs: Reviewed and stable  Last Vitals:  Vitals Value Taken Time  BP 143/88 04/07/20 1230  Temp    Pulse 82 04/07/20 1232  Resp 13 04/07/20 1232  SpO2 100 % 04/07/20 1232  Vitals shown include unvalidated device data.  Last Pain:  Vitals:   04/07/20 0734  TempSrc: Oral  PainSc: 0-No pain      Patients Stated Pain Goal: 5 (31/74/09 [id number])  Complications: No complications documented.

## 2020-04-07 NOTE — H&P (Signed)
Date of Initial H&P: 04/04/2020  .. planning robotic assisted laparoscopic hysterectomy with bilateral salpingectomy possible abdominal hysterectomy   History reviewed, patient examined, no change in status, stable for surgery.

## 2020-04-07 NOTE — Anesthesia Preprocedure Evaluation (Addendum)
Anesthesia Evaluation  Patient identified by MRN, date of birth, ID band Patient awake    Reviewed: Allergy & Precautions, NPO status , Patient's Chart, lab work & pertinent test results  History of Anesthesia Complications Negative for: history of anesthetic complications  Airway Mallampati: II  TM Distance: >3 FB Neck ROM: Full    Dental  (+) Partial Upper   Pulmonary neg pulmonary ROS,    Pulmonary exam normal        Cardiovascular negative cardio ROS Normal cardiovascular exam     Neuro/Psych negative neurological ROS  negative psych ROS   GI/Hepatic negative GI ROS, Neg liver ROS,   Endo/Other  negative endocrine ROS  Renal/GU negative Renal ROS  negative genitourinary   Musculoskeletal negative musculoskeletal ROS (+)   Abdominal   Peds  Hematology  (+) anemia , Hgb 8.6   Anesthesia Other Findings   Reproductive/Obstetrics Fibroids menorrhagia                           Anesthesia Physical Anesthesia Plan  ASA: III  Anesthesia Plan: General   Post-op Pain Management:    Induction: Intravenous  PONV Risk Score and Plan: 4 or greater and Ondansetron, Dexamethasone, Treatment may vary due to age or medical condition and Midazolam  Airway Management Planned: Oral ETT  Additional Equipment: None  Intra-op Plan:   Post-operative Plan: Extubation in OR  Informed Consent: I have reviewed the patients History and Physical, chart, labs and discussed the procedure including the risks, benefits and alternatives for the proposed anesthesia with the patient or authorized representative who has indicated his/her understanding and acceptance.     Dental advisory given  Plan Discussed with:   Anesthesia Plan Comments:       Anesthesia Quick Evaluation

## 2020-04-07 NOTE — Anesthesia Procedure Notes (Signed)
Procedure Name: Intubation Date/Time: 04/07/2020 9:58 AM Performed by: Saahas Hidrogo D, CRNA Pre-anesthesia Checklist: Patient identified, Emergency Drugs available, Suction available and Patient being monitored Patient Re-evaluated:Patient Re-evaluated prior to induction Oxygen Delivery Method: Circle system utilized Preoxygenation: Pre-oxygenation with 100% oxygen Induction Type: IV induction Ventilation: Mask ventilation without difficulty Laryngoscope Size: Mac and 3 Grade View: Grade I Tube type: Oral Tube size: 6.5 mm Number of attempts: 1 Airway Equipment and Method: Stylet Placement Confirmation: ETT inserted through vocal cords under direct vision,  positive ETCO2 and breath sounds checked- equal and bilateral Secured at: 21 cm Tube secured with: Tape Dental Injury: Teeth and Oropharynx as per pre-operative assessment

## 2020-04-08 DIAGNOSIS — D259 Leiomyoma of uterus, unspecified: Secondary | ICD-10-CM | POA: Diagnosis not present

## 2020-04-08 LAB — CBC
HCT: 22.6 % — ABNORMAL LOW (ref 36.0–46.0)
Hemoglobin: 6.7 g/dL — CL (ref 12.0–15.0)
MCH: 23.2 pg — ABNORMAL LOW (ref 26.0–34.0)
MCHC: 29.6 g/dL — ABNORMAL LOW (ref 30.0–36.0)
MCV: 78.2 fL — ABNORMAL LOW (ref 80.0–100.0)
Platelets: 252 10*3/uL (ref 150–400)
RBC: 2.89 MIL/uL — ABNORMAL LOW (ref 3.87–5.11)
RDW: 16.4 % — ABNORMAL HIGH (ref 11.5–15.5)
WBC: 6.6 10*3/uL (ref 4.0–10.5)
nRBC: 0 % (ref 0.0–0.2)

## 2020-04-08 LAB — SURGICAL PATHOLOGY

## 2020-04-08 MED ORDER — ACETAMINOPHEN 500 MG PO TABS
ORAL_TABLET | ORAL | Status: AC
  Filled 2020-04-08: qty 2

## 2020-04-08 MED ORDER — OXYCODONE HCL 5 MG PO TABS
ORAL_TABLET | ORAL | Status: AC
  Filled 2020-04-08: qty 1

## 2020-04-08 MED ORDER — ALUM & MAG HYDROXIDE-SIMETH 200-200-20 MG/5ML PO SUSP
ORAL | Status: AC
  Filled 2020-04-08: qty 30

## 2020-04-08 NOTE — Progress Notes (Signed)
CRITICAL VALUE ALERT  Critical Value:  Hgb 6.7  Date & Time Notied:  04/08/20  06:06  Provider Notified: Dr Landry Mellow  Orders Received/Actions taken: MD updated about patient's vitals and urine output overnight. RN to assess patient for any symptoms and continue to monitor patient.

## 2020-04-08 NOTE — Discharge Instructions (Signed)

## 2020-04-08 NOTE — Discharge Summary (Signed)
Physician Discharge Summary  Patient ID: Tonya Barber MRN: [id number] DOB/AGE: 1971-04-12 49 y.o.  Admit date: 04/07/2020 Discharge date: 04/08/2020  Admission Diagnoses: Menorrhagia, iron deficiency anemia, Uterine fibroids   Discharge Diagnoses: Same  Active Problems:   S/P hysterectomy   Discharged Condition: stable  Hospital Course: pt was admitted for observation after undergoing a robotic assisted laparoscopic hysterectomy with bilateral salpingectomy and right oophorectomy . She did well postoperatively with return of bowel and bladder function. Her hgb pod #1 was 6.7 down from 8.5 prior to surgery. This is thought to be more dilutional from IV fluids overnight. Pt is asymptomatic. She denies headache or dizziness with ambulation. She is tolerating po and pain is well controlled with po medication.   Consults: None  Significant Diagnostic Studies: labs: hgb pod #1 6.7  Treatments: surgery:  robotic assisted laparoscopic hysterectomy with bilateral salpingectomy and right oophorectomy .  Discharge Exam: Blood pressure 119/75, pulse 67, temperature 98.4 F (36.9 C), resp. rate 16, height 4' 11.5" (1.511 m), weight 65.8 kg, last menstrual period 04/02/2020, SpO2 100 %. General appearance: alert, cooperative and no distress Resp: nonlabored. no use of accessory muscles no signs of distress  GI: soft appropriately tender nondistended  no rebound no guarding  Extremities: extremities normal, atraumatic, no cyanosis or edema Incision/Wound: wound edges are well approximated no erythema or exudate   Disposition: Discharge disposition: 01-Home or Self Care       Discharge Instructions    Call MD for:  persistant nausea and vomiting   Complete by: As directed    Call MD for:  redness, tenderness, or signs of infection (pain, swelling, redness, odor or green/yellow discharge around incision site)   Complete by: As directed    Call MD for:  severe uncontrolled pain    Complete by: As directed    Call MD for:  temperature >100.4   Complete by: As directed    Diet - low sodium heart healthy   Complete by: As directed    Driving Restrictions   Complete by: As directed    Avoid driving for 1 week   Increase activity slowly   Complete by: As directed    Lifting restrictions   Complete by: As directed    Avoid lifting over 10 lbs   No wound care   Complete by: As directed    Sexual Activity Restrictions   Complete by: As directed    Avoid sex for 6-8 weeks until approved by Dr. Landry Mellow     Allergies as of 04/08/2020   No Known Allergies     Medication List    TAKE these medications   acetaminophen 650 MG CR tablet Commonly known as: TYLENOL Take 650 mg by mouth daily as needed for pain.   cholecalciferol 25 MCG (1000 UNIT) tablet Commonly known as: VITAMIN D3 Take 1,000 Units by mouth daily.   cyclobenzaprine 10 MG tablet Commonly known as: FLEXERIL Take 0.5-1 tablets (5-10 mg total) by mouth 3 (three) times daily as needed for muscle spasms.   ferrous sulfate 325 (65 FE) MG EC tablet Take 325 mg by mouth 3 (three) times daily with meals.   ibuprofen 800 MG tablet Commonly known as: ADVIL Take 1 tablet (800 mg total) by mouth every 8 (eight) hours as needed.   oxyCODONE 5 MG immediate release tablet Commonly known as: Oxy IR/ROXICODONE Take 1-2 tablets (5-10 mg total) by mouth every 4 (four) hours as needed for up to 7 days for moderate pain.  Signed: Christophe Louis 04/08/2020, 7:53 AM

## 2020-04-09 ENCOUNTER — Encounter (HOSPITAL_BASED_OUTPATIENT_CLINIC_OR_DEPARTMENT_OTHER): Payer: Self-pay | Admitting: Obstetrics and Gynecology

## 2020-05-25 ENCOUNTER — Other Ambulatory Visit: Payer: Self-pay | Admitting: Obstetrics and Gynecology

## 2020-05-25 DIAGNOSIS — Z1231 Encounter for screening mammogram for malignant neoplasm of breast: Secondary | ICD-10-CM

## 2020-07-07 ENCOUNTER — Other Ambulatory Visit: Payer: Self-pay

## 2020-07-07 ENCOUNTER — Ambulatory Visit
Admission: RE | Admit: 2020-07-07 | Discharge: 2020-07-07 | Disposition: A | Discharge status: Home / Self Care | Payer: 59 | Source: Ambulatory Visit | Attending: Obstetrics and Gynecology | Admitting: Obstetrics and Gynecology

## 2020-07-07 DIAGNOSIS — Z1231 Encounter for screening mammogram for malignant neoplasm of breast: Secondary | ICD-10-CM

## 2022-02-19 DIAGNOSIS — E559 Vitamin D deficiency, unspecified: Secondary | ICD-10-CM | POA: Diagnosis not present

## 2022-02-19 DIAGNOSIS — Z0000 Encounter for general adult medical examination without abnormal findings: Secondary | ICD-10-CM | POA: Diagnosis not present

## 2022-02-19 DIAGNOSIS — R5383 Other fatigue: Secondary | ICD-10-CM | POA: Diagnosis not present

## 2022-03-21 DIAGNOSIS — R69 Illness, unspecified: Secondary | ICD-10-CM | POA: Diagnosis not present

## 2022-11-14 DIAGNOSIS — Z6829 Body mass index (BMI) 29.0-29.9, adult: Secondary | ICD-10-CM | POA: Diagnosis not present

## 2022-11-14 DIAGNOSIS — Z713 Dietary counseling and surveillance: Secondary | ICD-10-CM | POA: Diagnosis not present

## 2022-12-07 DIAGNOSIS — Z713 Dietary counseling and surveillance: Secondary | ICD-10-CM | POA: Diagnosis not present

## 2022-12-07 DIAGNOSIS — Z6829 Body mass index (BMI) 29.0-29.9, adult: Secondary | ICD-10-CM | POA: Diagnosis not present

## 2023-01-20 IMAGING — MG MM DIGITAL SCREENING BILAT W/ TOMO AND CAD
8 series · 9 of 24 positions shown · non-contrast
Comparison: Previous exam(s).

CLINICAL DATA: Screening.

EXAM:
DIGITAL SCREENING BILATERAL MAMMOGRAM WITH TOMOSYNTHESIS AND CAD
TECHNIQUE: Bilateral screening digital craniocaudal and mediolateral oblique
mammograms were obtained. Bilateral screening digital breast
tomosynthesis was performed. The images were evaluated with
computer-aided detection.

[R CC synth-2D]
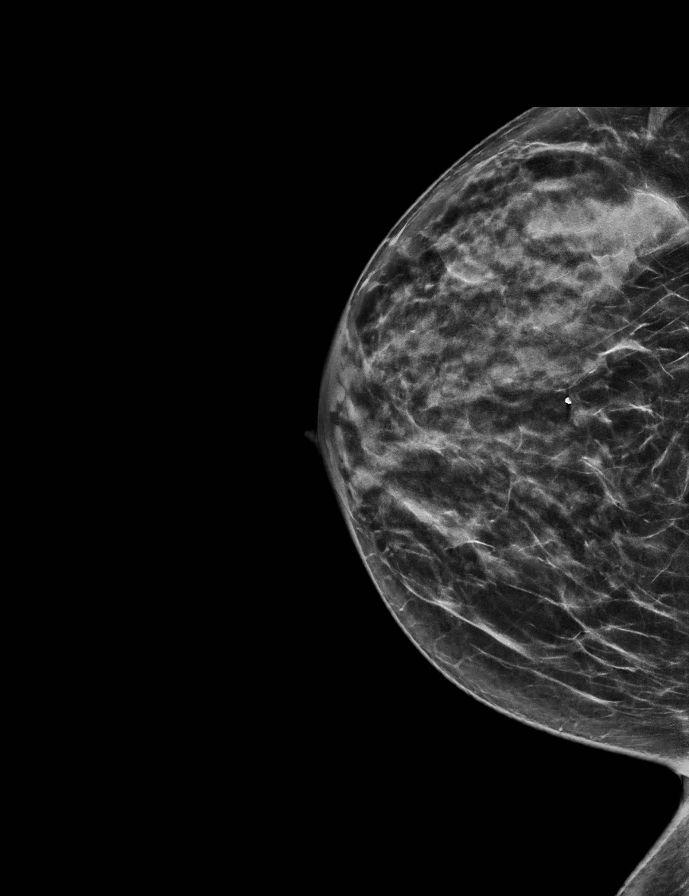

[R MLO synth-2D]
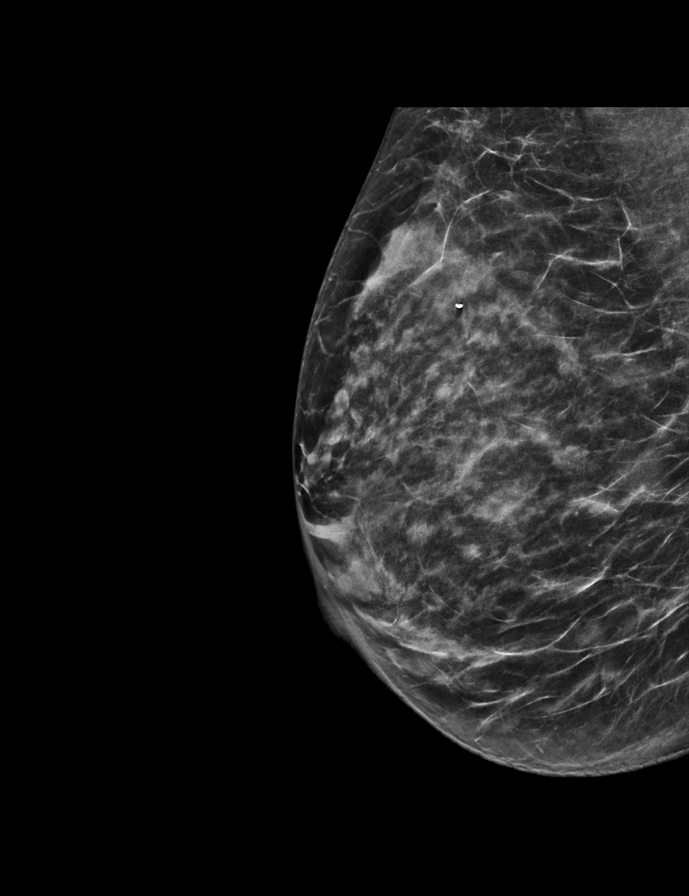

[L MLO synth-2D]
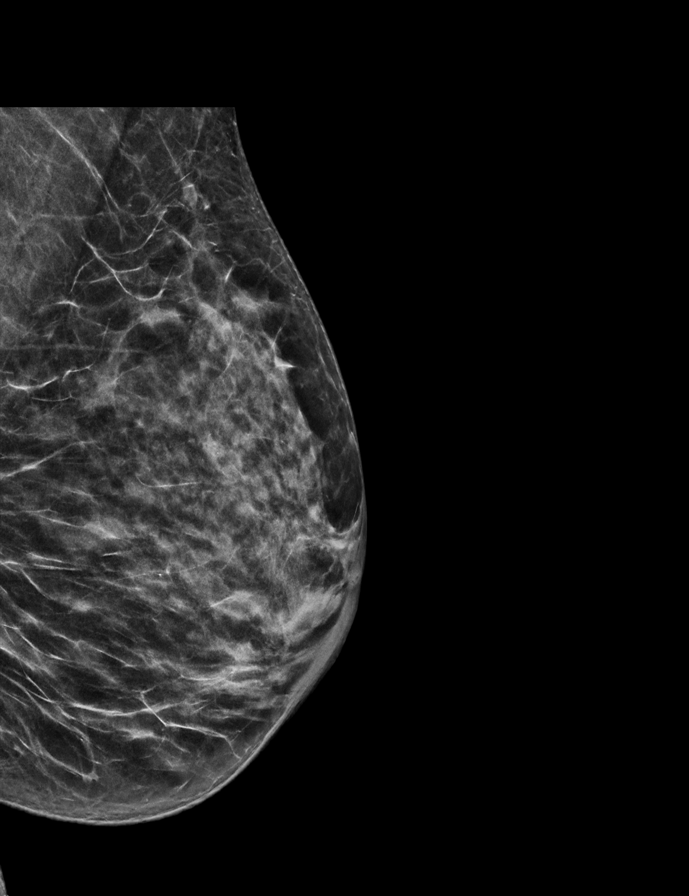

[L CC synth-2D]
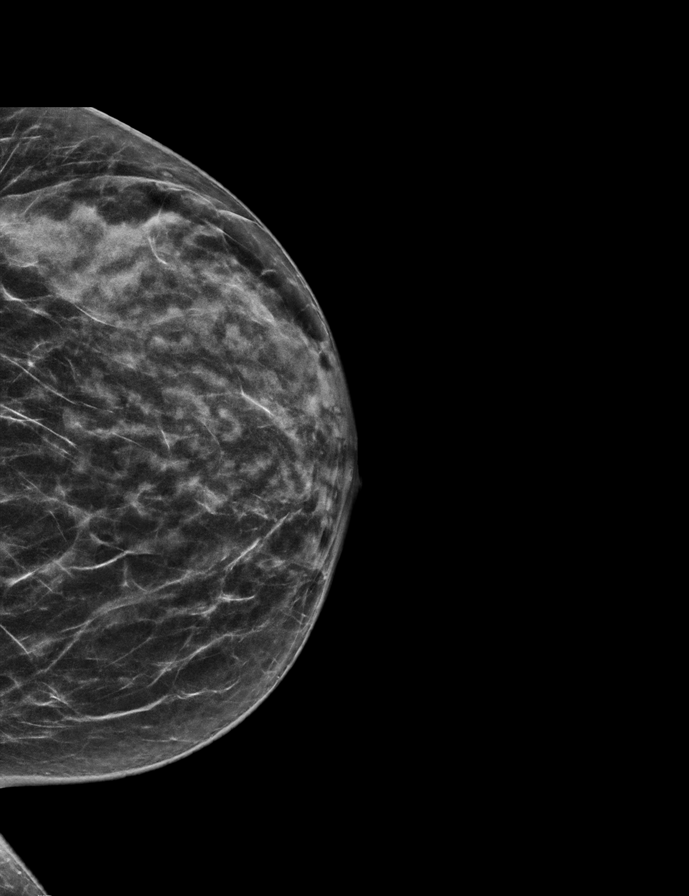

[R MLO tomo · 2 of 57 frames shown]
[frame 19/57]
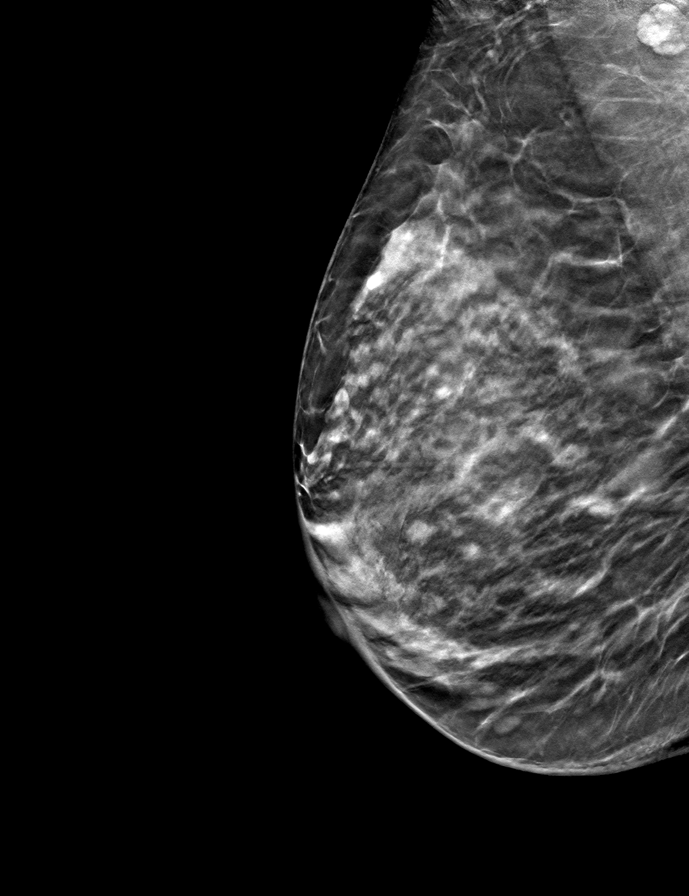
[frame 29/57]
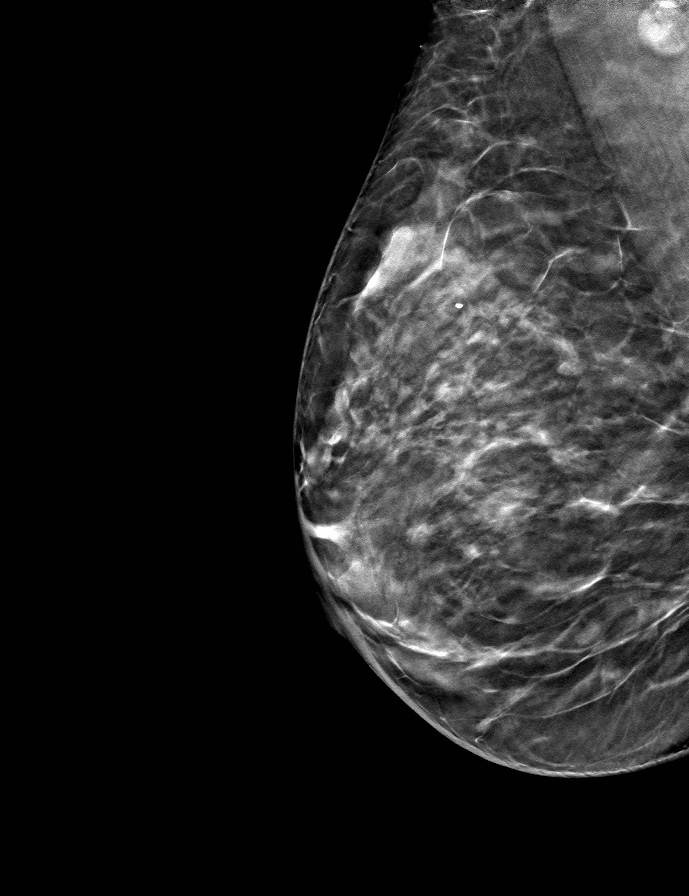

[L CC tomo · tomo slice 29/56.0]
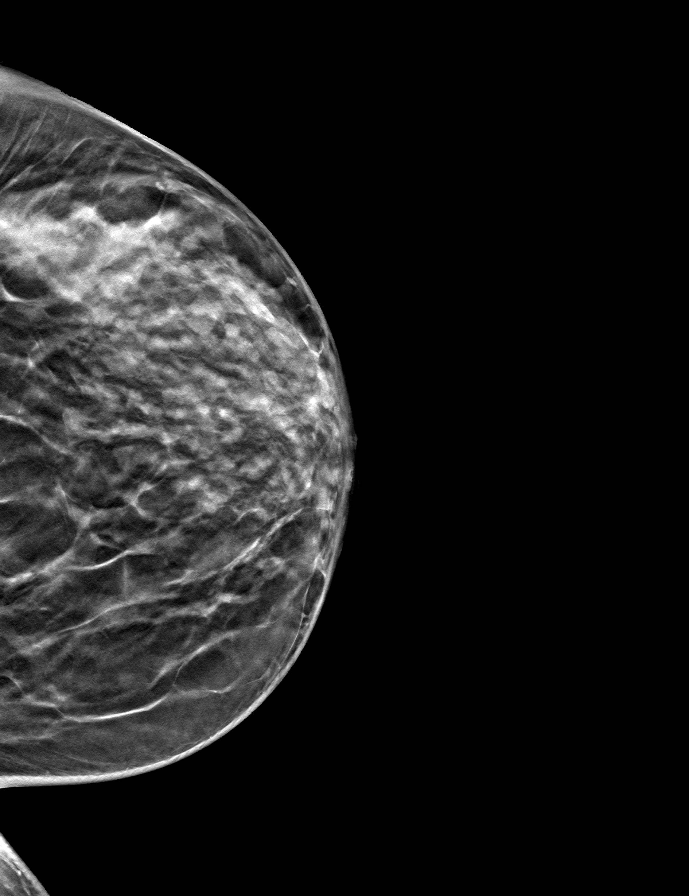

[L MLO tomo · tomo slice 27/54.0]
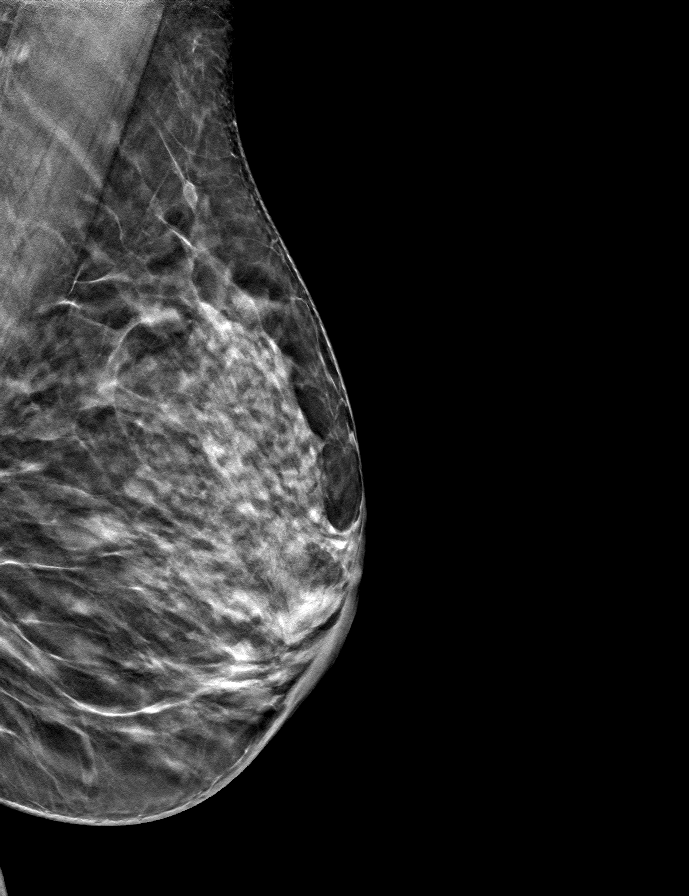

[R CC tomo · tomo slice 31/60.0]
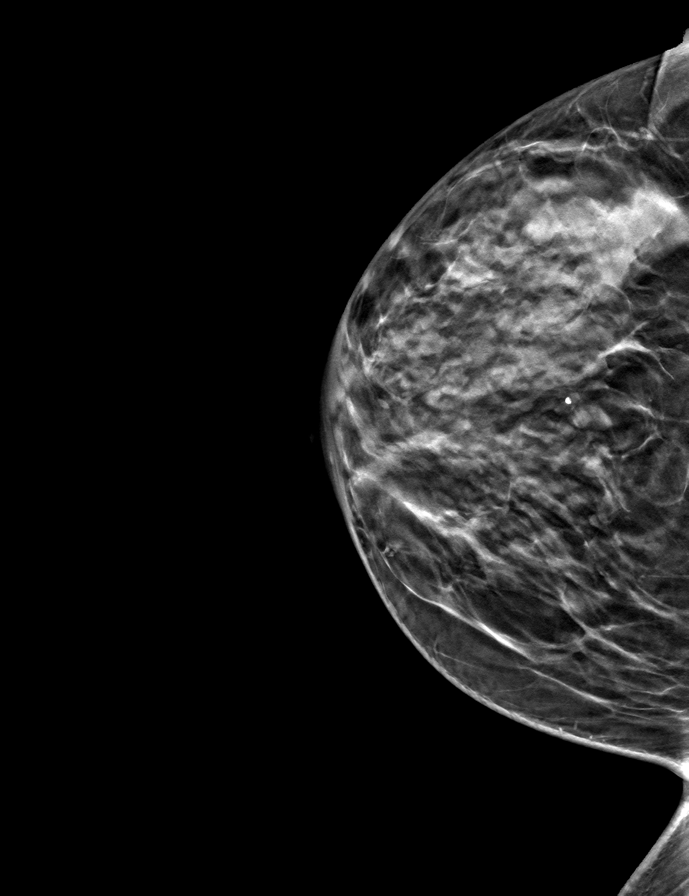

[9 of 24 positions shown; findings below may reference images not displayed]

ACR Breast Density Category c: The breast tissue is heterogeneously
dense, which may obscure small masses.
FINDINGS: There are no findings suspicious for malignancy. The images were
evaluated with computer-aided detection.
IMPRESSION: No mammographic evidence of malignancy. A result letter of this
screening mammogram will be mailed directly to the patient.

RECOMMENDATION:
Screening mammogram in one year. (Code:T4-5-GWO)

BI-RADS CATEGORY  1: Negative.

## 2023-03-28 DIAGNOSIS — I10 Essential (primary) hypertension: Secondary | ICD-10-CM | POA: Diagnosis not present

## 2023-03-28 DIAGNOSIS — K219 Gastro-esophageal reflux disease without esophagitis: Secondary | ICD-10-CM | POA: Diagnosis not present

## 2023-04-03 DIAGNOSIS — Z114 Encounter for screening for human immunodeficiency virus [HIV]: Secondary | ICD-10-CM | POA: Diagnosis not present

## 2023-04-03 DIAGNOSIS — Z13220 Encounter for screening for lipoid disorders: Secondary | ICD-10-CM | POA: Diagnosis not present

## 2023-04-03 DIAGNOSIS — Z0000 Encounter for general adult medical examination without abnormal findings: Secondary | ICD-10-CM | POA: Diagnosis not present

## 2023-04-03 DIAGNOSIS — K219 Gastro-esophageal reflux disease without esophagitis: Secondary | ICD-10-CM | POA: Diagnosis not present

## 2023-04-10 ENCOUNTER — Other Ambulatory Visit: Payer: Self-pay | Admitting: Family Medicine

## 2023-04-10 DIAGNOSIS — D649 Anemia, unspecified: Secondary | ICD-10-CM | POA: Diagnosis not present

## 2023-04-10 DIAGNOSIS — Z1231 Encounter for screening mammogram for malignant neoplasm of breast: Secondary | ICD-10-CM

## 2023-04-10 DIAGNOSIS — I10 Essential (primary) hypertension: Secondary | ICD-10-CM | POA: Diagnosis not present

## 2023-04-10 DIAGNOSIS — Z0000 Encounter for general adult medical examination without abnormal findings: Secondary | ICD-10-CM | POA: Diagnosis not present

## 2023-04-23 DIAGNOSIS — K219 Gastro-esophageal reflux disease without esophagitis: Secondary | ICD-10-CM | POA: Diagnosis not present

## 2023-04-30 ENCOUNTER — Ambulatory Visit: Payer: 59

## 2023-04-30 DIAGNOSIS — I10 Essential (primary) hypertension: Secondary | ICD-10-CM | POA: Diagnosis not present

## 2023-04-30 DIAGNOSIS — K219 Gastro-esophageal reflux disease without esophagitis: Secondary | ICD-10-CM | POA: Diagnosis not present

## 2023-04-30 DIAGNOSIS — D509 Iron deficiency anemia, unspecified: Secondary | ICD-10-CM | POA: Diagnosis not present

## 2023-05-17 ENCOUNTER — Ambulatory Visit
Admission: RE | Admit: 2023-05-17 | Discharge: 2023-05-17 | Disposition: A | Discharge status: Home / Self Care | Payer: 59 | Source: Ambulatory Visit | Attending: Family Medicine | Admitting: Family Medicine

## 2023-05-17 DIAGNOSIS — Z1231 Encounter for screening mammogram for malignant neoplasm of breast: Secondary | ICD-10-CM

## 2023-05-21 DIAGNOSIS — I10 Essential (primary) hypertension: Secondary | ICD-10-CM | POA: Diagnosis not present

## 2023-05-21 DIAGNOSIS — K219 Gastro-esophageal reflux disease without esophagitis: Secondary | ICD-10-CM | POA: Diagnosis not present

## 2023-06-11 ENCOUNTER — Encounter: Payer: Self-pay | Admitting: Internal Medicine

## 2023-08-16 DIAGNOSIS — R5383 Other fatigue: Secondary | ICD-10-CM | POA: Diagnosis not present

## 2023-08-24 DIAGNOSIS — E669 Obesity, unspecified: Secondary | ICD-10-CM | POA: Diagnosis not present

## 2023-08-24 DIAGNOSIS — Z9884 Bariatric surgery status: Secondary | ICD-10-CM | POA: Diagnosis not present

## 2023-08-24 DIAGNOSIS — K219 Gastro-esophageal reflux disease without esophagitis: Secondary | ICD-10-CM | POA: Diagnosis not present

## 2023-08-24 DIAGNOSIS — A048 Other specified bacterial intestinal infections: Secondary | ICD-10-CM | POA: Diagnosis not present

## 2023-08-24 DIAGNOSIS — D649 Anemia, unspecified: Secondary | ICD-10-CM | POA: Diagnosis not present

## 2023-08-24 DIAGNOSIS — Z8639 Personal history of other endocrine, nutritional and metabolic disease: Secondary | ICD-10-CM | POA: Diagnosis not present

## 2023-08-24 DIAGNOSIS — D72819 Decreased white blood cell count, unspecified: Secondary | ICD-10-CM | POA: Diagnosis not present

## 2023-08-30 ENCOUNTER — Encounter: Payer: 59 | Admitting: Internal Medicine

## 2023-10-17 ENCOUNTER — Encounter: Payer: Self-pay | Admitting: *Deleted

## 2023-10-17 DIAGNOSIS — D509 Iron deficiency anemia, unspecified: Secondary | ICD-10-CM | POA: Diagnosis not present

## 2023-10-17 DIAGNOSIS — E66811 Obesity, class 1: Secondary | ICD-10-CM | POA: Insufficient documentation

## 2023-10-17 DIAGNOSIS — E559 Vitamin D deficiency, unspecified: Secondary | ICD-10-CM | POA: Insufficient documentation

## 2023-10-17 DIAGNOSIS — N951 Menopausal and female climacteric states: Secondary | ICD-10-CM | POA: Insufficient documentation

## 2023-10-17 DIAGNOSIS — K219 Gastro-esophageal reflux disease without esophagitis: Secondary | ICD-10-CM | POA: Diagnosis not present

## 2023-10-17 DIAGNOSIS — I10 Essential (primary) hypertension: Secondary | ICD-10-CM | POA: Diagnosis not present

## 2023-10-24 ENCOUNTER — Telehealth: Payer: Self-pay | Admitting: *Deleted

## 2023-10-24 NOTE — Telephone Encounter (Signed)
 Patient's referral from Suzen Rea, NP  was for an endo/colon  ( patient was treated for H. Pylori and continues to have GERD.)  Can you confirm if she will need an OV first or may she be directly scheduled for the EGD portion?

## 2023-10-25 NOTE — Telephone Encounter (Signed)
 EGD added onto colon for 7/22. Patient made aware of add on, no OV needed.

## 2023-10-25 NOTE — Telephone Encounter (Signed)
 Left message for patient to call back

## 2023-10-25 NOTE — Telephone Encounter (Signed)
 Patient is currently scheduled for screening colon with Dr. Federico 7/22, however referral requested an EGD as well. Confirmed with patient that she does need EGD d/t reflux & history of H. Pylori. She was treated for this about 3 months ago & has upcoming lab to confirm eradication soon. Will check with Dr. Federico to see if OV is needed or if we can add on EGD for that day as well.

## 2023-11-01 ENCOUNTER — Ambulatory Visit

## 2023-11-01 ENCOUNTER — Other Ambulatory Visit: Payer: Self-pay

## 2023-11-01 VITALS — Ht 59.5 in | Wt 156.0 lb

## 2023-11-01 DIAGNOSIS — K219 Gastro-esophageal reflux disease without esophagitis: Secondary | ICD-10-CM

## 2023-11-01 DIAGNOSIS — Z1211 Encounter for screening for malignant neoplasm of colon: Secondary | ICD-10-CM

## 2023-11-01 MED ORDER — NA SULFATE-K SULFATE-MG SULF 17.5-3.13-1.6 GM/177ML PO SOLN: 1.0000 | Freq: Once | ORAL | 0 refills | Status: AC

## 2023-11-01 NOTE — Progress Notes (Signed)
 Denies allergies to eggs or soy products. Denies complication of anesthesia or sedation. Denies use of weight loss medication. Denies use of O2.   Emmi instructions given for colonoscopy.

## 2023-11-07 ENCOUNTER — Encounter: Payer: Self-pay | Admitting: Internal Medicine

## 2023-11-20 ENCOUNTER — Encounter: Payer: Self-pay | Admitting: Internal Medicine

## 2023-11-20 ENCOUNTER — Ambulatory Visit (AMBULATORY_SURGERY_CENTER): Admitting: Internal Medicine

## 2023-11-20 VITALS — BP 128/86 | HR 88 | Temp 98.9°F | Resp 16 | Ht 59.5 in | Wt 146.0 lb

## 2023-11-20 DIAGNOSIS — R1310 Dysphagia, unspecified: Secondary | ICD-10-CM

## 2023-11-20 DIAGNOSIS — I10 Essential (primary) hypertension: Secondary | ICD-10-CM | POA: Diagnosis not present

## 2023-11-20 DIAGNOSIS — Z1211 Encounter for screening for malignant neoplasm of colon: Secondary | ICD-10-CM

## 2023-11-20 DIAGNOSIS — D509 Iron deficiency anemia, unspecified: Secondary | ICD-10-CM | POA: Diagnosis not present

## 2023-11-20 DIAGNOSIS — K229 Disease of esophagus, unspecified: Secondary | ICD-10-CM | POA: Diagnosis not present

## 2023-11-20 DIAGNOSIS — K2950 Unspecified chronic gastritis without bleeding: Secondary | ICD-10-CM | POA: Diagnosis not present

## 2023-11-20 DIAGNOSIS — Z8619 Personal history of other infectious and parasitic diseases: Secondary | ICD-10-CM

## 2023-11-20 DIAGNOSIS — K219 Gastro-esophageal reflux disease without esophagitis: Secondary | ICD-10-CM

## 2023-11-20 DIAGNOSIS — K449 Diaphragmatic hernia without obstruction or gangrene: Secondary | ICD-10-CM | POA: Diagnosis not present

## 2023-11-20 DIAGNOSIS — K648 Other hemorrhoids: Secondary | ICD-10-CM | POA: Diagnosis not present

## 2023-11-20 DIAGNOSIS — F419 Anxiety disorder, unspecified: Secondary | ICD-10-CM | POA: Diagnosis not present

## 2023-11-20 DIAGNOSIS — K222 Esophageal obstruction: Secondary | ICD-10-CM

## 2023-11-20 MED ORDER — SODIUM CHLORIDE 0.9 % IV SOLN: 500.0000 mL | Freq: Once | INTRAVENOUS | Status: DC

## 2023-11-20 NOTE — Progress Notes (Signed)
 Sedate, gd SR, tolerated procedure well, VSS, report to RN

## 2023-11-20 NOTE — Progress Notes (Signed)
 Called to room to assist during endoscopic procedure.  Patient ID and intended procedure confirmed with present staff. Received instructions for my participation in the procedure from the performing physician.

## 2023-11-20 NOTE — Op Note (Addendum)
 [facility] Patient Name: Tonya Barber Procedure Date: 11/20/2023 1:28 PM MRN: [id number] Endoscopist: Rosario Estefana Kidney , , [id number] Age: 53 Referring MD:  Date of Birth: [birth year] Gender: Female Account #: [id number] Procedure:                Upper GI endoscopy Indications:              Iron deficiency anemia, Dysphagia, Heartburn,                            Follow-up of Helicobacter pylori Medicines:                Monitored Anesthesia Care Procedure:                Pre-Anesthesia Assessment:                           - Prior to the procedure, a History and Physical                            was performed, and patient medications and                            allergies were reviewed. The patient's tolerance of                            previous anesthesia was also reviewed. The risks                            and benefits of the procedure and the sedation                            options and risks were discussed with the patient.                            All questions were answered, and informed consent                            was obtained. Prior Anticoagulants: The patient has                            taken no anticoagulant or antiplatelet agents. ASA                            Grade Assessment: II - A patient with mild systemic                            disease. After reviewing the risks and benefits,                            the patient was deemed in satisfactory condition to                            undergo the procedure.  After obtaining informed consent, the endoscope was                            passed under direct vision. Throughout the                            procedure, the patient's blood pressure, pulse, and                            oxygen saturations were monitored continuously. The                            Olympus Scope SN [id number] was introduced through the                            mouth, and  advanced to the second part of duodenum.                            The upper GI endoscopy was accomplished without                            difficulty. The patient tolerated the procedure                            well. Scope In: Scope Out: Findings:                 One benign-appearing, intrinsic moderate                            (circumferential scarring or stenosis; an endoscope                            may pass) stenosis was found in the proximal                            esophagus. This stenosis measured less than one cm                            (in length). The stenosis was traversed. A                            guidewire was placed and the scope was withdrawn.                            Dilation was performed with a Savary dilator with                            mild resistance at 19 mm. The dilation site was                            examined following endoscope reinsertion and showed  mild mucosal disruption. Biopsies were taken with a                            cold forceps for histology.                           A small hiatal hernia was present.                           Localized inflammation characterized by congestion                            (edema), erythema and granularity was found in the                            gastric fundus and in the gastric body. Biopsies                            were taken with a cold forceps for histology.                           The examined duodenum was normal. Complications:            No immediate complications. Estimated Blood Loss:     Estimated blood loss was minimal. Impression:               - Benign-appearing esophageal stenosis. Dilated.                            Biopsied.                           - Small hiatal hernia.                           - Gastritis. Biopsied.                           - Normal examined duodenum. Recommendation:           - Await pathology results.                            - Continue omeprazole 40 mg daily.                           - Referral placed to bariatric surgery with [facility] due                            to patient's history of lap band surgery.                           - Return to GI clinic in 2-3 months.                           - Perform a colonoscopy today. Dr Estefana Federico Rosario Estefana Federico,  11/20/2023 2:13:18 PM

## 2023-11-20 NOTE — Progress Notes (Signed)
Vs by CW  Pt's states no medical or surgical changes since previsit or office visit.

## 2023-11-20 NOTE — Op Note (Signed)
 [facility] Patient Name: Tonya Barber Procedure Date: 11/20/2023 1:27 PM MRN: [id number] Endoscopist: Rosario Estefana Kidney , , [id number] Age: 53 Referring MD:  Date of Birth: 1970/11/24 Gender: Female Account #: [id number] Procedure:                Colonoscopy Indications:              Screening for colorectal malignant neoplasm, This                            is the patient's first colonoscopy Medicines:                Monitored Anesthesia Care Procedure:                Pre-Anesthesia Assessment:                           - Prior to the procedure, a History and Physical                            was performed, and patient medications and                            allergies were reviewed. The patient's tolerance of                            previous anesthesia was also reviewed. The risks                            and benefits of the procedure and the sedation                            options and risks were discussed with the patient.                            All questions were answered, and informed consent                            was obtained. Prior Anticoagulants: The patient has                            taken no anticoagulant or antiplatelet agents. ASA                            Grade Assessment: II - A patient with mild systemic                            disease. After reviewing the risks and benefits,                            the patient was deemed in satisfactory condition to                            undergo the procedure.  After obtaining informed consent, the colonoscope                            was passed under direct vision. Throughout the                            procedure, the patient's blood pressure, pulse, and                            oxygen saturations were monitored continuously. The                            Olympus Scope [id number] was introduced through the                            anus and advanced  to the the cecum, identified by                            appendiceal orifice and ileocecal valve. The                            colonoscopy was performed without difficulty. The                            patient tolerated the procedure well. The quality                            of the bowel preparation was excellent. The                            ileocecal valve, appendiceal orifice, and rectum                            were photographed. Scope In: 1:51:39 PM Scope Out: 2:07:17 PM Scope Withdrawal Time: 0 hours 11 minutes 2 seconds  Total Procedure Duration: 0 hours 15 minutes 38 seconds  Findings:                 Non-bleeding internal hemorrhoids were found during                            retroflexion. Complications:            No immediate complications. Estimated Blood Loss:     Estimated blood loss: none. Impression:               - Non-bleeding internal hemorrhoids.                           - No specimens collected. Recommendation:           - Discharge patient to home (with escort).                           - Repeat colonoscopy in 10 years for screening  purposes.                           - The findings and recommendations were discussed                            with the patient. Dr Estefana Federico Rosario Estefana Federico,  11/20/2023 2:15:06 PM

## 2023-11-20 NOTE — Patient Instructions (Addendum)
 -Handout on gastritis provided. -await pathology results. -repeat colonoscopy in 10 years for surveillance recommended.  -Continue present medications. Continue Omeprazole 40 mg daily  YOU HAD AN ENDOSCOPIC PROCEDURE TODAY AT THE [facility]:   Refer to the procedure report that was given to you for any specific questions about what was found during the examination.  If the procedure report does not answer your questions, please call your gastroenterologist to clarify.  If you requested that your care partner not be given the details of your procedure findings, then the procedure report has been included in a sealed envelope for you to review at your convenience later.  YOU SHOULD EXPECT: Some feelings of bloating in the abdomen. Passage of more gas than usual.  Walking can help get rid of the air that was put into your GI tract during the procedure and reduce the bloating. If you had a lower endoscopy (such as a colonoscopy or flexible sigmoidoscopy) you may notice spotting of blood in your stool or on the toilet paper. If you underwent a bowel prep for your procedure, you may not have a normal bowel movement for a few days.  Please Note:  You might notice some irritation and congestion in your nose or some drainage.  This is from the oxygen used during your procedure.  There is no need for concern and it should clear up in a day or so.  SYMPTOMS TO REPORT IMMEDIATELY:  Following lower endoscopy (colonoscopy or flexible sigmoidoscopy):  Excessive amounts of blood in the stool  Significant tenderness or worsening of abdominal pains  Swelling of the abdomen that is new, acute  Fever of 100F or higher  Following upper endoscopy (EGD)  Vomiting of blood or coffee ground material  New chest pain or pain under the shoulder blades  Painful or persistently difficult swallowing  New shortness of breath  Fever of 100F or higher  Black, tarry-looking stools  For urgent or emergent  issues, a gastroenterologist can be reached at any hour by calling [phone]. Do not use MyChart messaging for urgent concerns.    DIET:  We do recommend a small meal at first, but then you may proceed to your regular diet.  Drink plenty of fluids but you should avoid alcoholic beverages for 24 hours.  ACTIVITY:  You should plan to take it easy for the rest of today and you should NOT DRIVE or use heavy machinery until tomorrow (because of the sedation medicines used during the test).    FOLLOW UP: Our staff will call the number listed on your records the next business day following your procedure.  We will call around 7:15- 8:00 am to check on you and address any questions or concerns that you may have regarding the information given to you following your procedure. If we do not reach you, we will leave a message.     If any biopsies were taken you will be contacted by phone or by letter within the next 1-3 weeks.  Please call us  at [phone] if you have not heard about the biopsies in 3 weeks.    SIGNATURES/CONFIDENTIALITY: You and/or your care partner have signed paperwork which will be entered into your electronic medical record.  These signatures attest to the fact that that the information above on your After Visit Summary has been reviewed and is understood.  Full responsibility of the confidentiality of this discharge information lies with you and/or your care-partner.  Gastritis, Adult Gastritis is irritation  and swelling (inflammation) of the stomach. There are two kinds of gastritis: Acute gastritis. This kind develops quickly. Chronic gastritis. This kind is much more common. It develops slowly and lasts for a long time. It is important to get help for this condition. If you do not get help, your stomach can bleed, and you can get sores (ulcers) in your stomach. What are the causes? This condition may be caused by: Germs that get to your stomach and cause an  infection. Drinking too much alcohol. Medicines you are taking. Having too much acid in the stomach. Having a disease of the stomach. Other causes may include: An allergic reaction. Some cancer treatments (radiation). Smoking cigarettes or using products that contain nicotine or tobacco. In some cases, the cause of this condition is not known. What increases the risk? Having a disease of the intestines. Having Crohn's disease. Using aspirin or ibuprofen  and other NSAIDs to treat other conditions. Stress. What are the signs or symptoms? Pain in your stomach. A burning feeling in your stomach. Feeling like you may vomit (nauseous). Vomiting or vomiting blood. Feeling too full after you eat. Weight loss. Bad breath. Blood in your poop (stool). In some cases, there are no symptoms. How is this treated? This condition is treated with medicines. The medicines that are used depend on what caused the condition. You may be given: Antibiotic medicine, if your condition was caused by an infection from germs. H2 blockers and similar medicines, if your condition was caused by too much acid in the stomach. Treatment may also include stopping the use of certain medicines, such as aspirin or ibuprofen . Follow these instructions at home: Medicines Take over-the-counter and prescription medicines only as told by your doctor. If you were prescribed an antibiotic medicine, take it as told by your doctor. Do not stop taking it even if you start to feel better. Alcohol use Do not drink alcohol if: Your doctor tells you not to drink. You are pregnant, may be pregnant, or are planning to become pregnant. If you drink alcohol: Limit your use to: 0-1 drink a day for women. 0-2 drinks a day for men. Know how much alcohol is in your drink. In the [place] one drink equals one 12 oz bottle of beer (355 mL), one 5 oz glass of wine (148 mL), or one 1 oz glass of hard liquor (44 mL). General  instructions  Eat small meals often, instead of large meals. Avoid foods and drinks that make you feel worse. Drink enough fluid to keep your pee (urine) pale yellow. Talk with your doctor about ways to manage stress. You can exercise or do deep breathing, meditation, or yoga. Do not smoke or use any products that contain nicotine or tobacco. If you need help quitting, ask your doctor. Keep all follow-up visits. Contact a doctor if: Your symptoms get worse. Your stomach pain gets worse. Your symptoms go away and then come back. You have a fever. Get help right away if: You vomit blood or something that looks like coffee grounds. You have black or dark red poop. You throw up any time you try to drink fluids. These symptoms may be an emergency. Get help right away. Call your local emergency services (911 in the [place] Do not wait to see if the symptoms will go away. Do not drive yourself to the hospital. Summary Gastritis is irritation and swelling (inflammation) of the stomach. You must get help for this condition. If you do not get help, your  stomach can bleed, and you can get sores (ulcers) in your stomach. You can be treated with medicines for germs or medicines to block too much acid in your stomach. This information is not intended to replace advice given to you by your health care provider. Make sure you discuss any questions you have with your health care provider. Document Revised: 08/21/2020 Document Reviewed: 08/21/2020 Elsevier Patient Education  2024 [employer]  Hiatal Hernia  A hiatal hernia occurs when part of the stomach slides above the muscle that separates the abdomen from the chest (diaphragm). A person can be born with a hiatal hernia (congenital), or it may develop over time. In almost all cases of hiatal hernia, only the top part of the stomach pushes through the diaphragm. Many people have a hiatal hernia with no symptoms. The larger the hernia, the more  likely it is that you will have symptoms. In some cases, a hiatal hernia allows stomach acid to flow back into the tube that carries food from your mouth to your stomach (esophagus). This may cause heartburn symptoms. The development of heartburn symptoms may mean that you have a condition called gastroesophageal reflux disease (GERD). What are the causes? This condition is caused by a weakness in the opening (hiatus) where the esophagus passes through the diaphragm to attach to the upper part of the stomach. A person may be born with a weakness in the hiatus, or a weakness can develop over time. What increases the risk? This condition is more likely to develop in: Older people. Age is a major risk factor for a hiatal hernia, especially if you are over the age of 36. Pregnant women. People who are overweight. People who have frequent constipation. What are the signs or symptoms? Symptoms of this condition usually develop in the form of GERD symptoms. Symptoms include: Heartburn. Upset stomach (indigestion). Trouble swallowing. Coughing or wheezing. Wheezing is making high-pitched whistling sounds when you breathe. Sore throat. Chest pain. Nausea and vomiting. How is this diagnosed? This condition may be diagnosed during testing for GERD. Tests that may be done include: X-rays of your stomach or chest. An upper gastrointestinal (GI) series. This is an X-ray exam of your GI tract that is taken after you swallow a chalky liquid that shows up clearly on the X-ray. Endoscopy. This is a procedure to look into your stomach using a thin, flexible tube that has a tiny camera and light on the end of it. How is this treated? This condition may be treated by: Dietary and lifestyle changes to help reduce GERD symptoms. Medicines. These may include: Over-the-counter antacids. Medicines that make your stomach empty more quickly. Medicines that block the production of stomach acid (H2  blockers). Stronger medicines to reduce stomach acid (proton pump inhibitors). Surgery to repair the hernia, if other treatments are not helping. If you have no symptoms, you may not need treatment. Follow these instructions at home: Lifestyle and activity Do not use any products that contain nicotine or tobacco. These products include cigarettes, chewing tobacco, and vaping devices, such as e-cigarettes. If you need help quitting, ask your health care provider. Try to achieve and maintain a healthy body weight. Avoid putting pressure on your abdomen. Anything that puts pressure on your abdomen increases the amount of acid that may be pushed up into your esophagus. Avoid bending over, especially after eating. Raise the head of your bed by putting blocks under the legs. This keeps your head and esophagus higher than your stomach. Do  not wear tight clothing around your chest or stomach. Try not to strain when having a bowel movement, when urinating, or when lifting heavy objects. Eating and drinking Avoid foods that can worsen GERD symptoms. These may include: Fatty foods, like fried foods. Citrus fruits, like oranges or lemon. Other foods and drinks that contain acid, like orange juice or tomatoes. Spicy food. Chocolate. Eat frequent small meals instead of three large meals a day. This helps prevent your stomach from getting too full. Eat slowly. Do not lie down right after eating. Do not eat 1-2 hours before bed. Do not drink beverages with caffeine. These include cola, coffee, cocoa, and tea. Do not drink alcohol. General instructions Take over-the-counter and prescription medicines only as told by your health care provider. Keep all follow-up visits. Your health care provider will want to check that any new prescribed medicines are helping your symptoms. Contact a health care provider if: Your symptoms are not controlled with medicines or lifestyle changes. You are having trouble  swallowing. You have coughing or wheezing that will not go away. Your pain is getting worse. Your pain spreads to your arms, neck, jaw, teeth, or back. You feel nauseous or you vomit. Get help right away if: You have shortness of breath. You vomit blood. You have bright red blood in your stools. You have black, tarry stools. These symptoms may be an emergency. Get help right away. Call 911. Do not wait to see if the symptoms will go away. Do not drive yourself to the hospital. Summary A hiatal hernia occurs when part of the stomach slides above the muscle that separates the abdomen from the chest. A person may be born with a weakness in the hiatus, or a weakness can develop over time. Symptoms of a hiatal hernia may include heartburn, trouble swallowing, or sore throat. Management of a hiatal hernia includes eating frequent small meals instead of three large meals a day. Get help right away if you vomit blood, have bright red blood in your stools, or have black, tarry stools. This information is not intended to replace advice given to you by your health care provider. Make sure you discuss any questions you have with your health care provider. Document Revised: 06/14/2021 Document Reviewed: 06/14/2021 Elsevier Patient Education  2024 [employer].

## 2023-11-20 NOTE — Progress Notes (Signed)
 GASTROENTEROLOGY PROCEDURE H&P NOTE   Primary Care Physician: Waylan Almarie SAUNDERS, MD    Reason for Procedure:   History of H pylori, dysphagia, GERD, IDA, colon cancer screening  Plan:    EGD/colonoscopy  Patient is appropriate for endoscopic procedure(s) in the ambulatory ([facility]) setting.  The nature of the procedure, as well as the risks, benefits, and alternatives were carefully and thoroughly reviewed with the patient. Ample time for discussion and questions allowed. The patient understood, was satisfied, and agreed to proceed.     HPI: Tonya Barber is a 53 y.o. female who presents for EGD/colonoscopy for evaluation of history of H pylori, dysphagia, GERD, IDA, and colon cancer screening. Her last EGD was done prior to her lap band surgery that was done over 10 years ago and was normal. Denies any blood in the stools. She does have IDA and no longer has periods after she had a hysterectomy done a few years ago. Prior to that, she did have heavy menstrual periods. Endorses dysphagia that gets caught in her chest. Denies ab pain. Denies weight loss. Denies family history of GI cancers.  Past Medical History:  Diagnosis Date   Anemia    Anxiety    Blood transfusion without reported diagnosis    Complication of anesthesia    It is hard to wake up after anesthesia.   GERD (gastroesophageal reflux disease)    Heart murmur    Hypertension     Past Surgical History:  Procedure Laterality Date   IUD REMOVAL     LAPAROSCOPIC GASTRIC BANDING     ROBOTIC ASSISTED TOTAL HYSTERECTOMY WITH BILATERAL SALPINGO OOPHERECTOMY Bilateral 04/07/2020   Procedure: XI ROBOTIC ASSISTED TOTAL HYSTERECTOMY WITH BILATERAL SALPINGECTOMY AND RIGHT OOPHECTOMY;  Surgeon: Rosalva Sawyer, MD;  Location: [facility];  Service: Gynecology;  Laterality: Bilateral;  Tracie to RNFA confirmed on 02/25/20 CS   TUBAL LIGATION      Prior to Admission medications   Medication Sig Start Date End  Date Taking? Authorizing Provider  acetaminophen  (TYLENOL ) 650 MG CR tablet Take 650 mg by mouth daily as needed for pain.    [provider]  cholecalciferol (VITAMIN D3) 25 MCG (1000 UNIT) tablet Take 1,000 Units by mouth daily.    [provider]  cyclobenzaprine  (FLEXERIL ) 10 MG tablet Take 0.5-1 tablets (5-10 mg total) by mouth 3 (three) times daily as needed for muscle spasms. Patient not taking: Reported on 11/23/2019 10/06/15   Laverne Prentice HERO, MD  ferrous sulfate 325 (65 FE) MG EC tablet Take 325 mg by mouth 3 (three) times daily with meals.    [provider]  ibuprofen  (ADVIL ) 800 MG tablet Take 1 tablet (800 mg total) by mouth every 8 (eight) hours as needed. 04/07/20   Rosalva Sawyer, MD  Multiple Vitamin (MULTIVITAMIN) tablet Take 1 tablet by mouth daily.    [provider]  omeprazole (PRILOSEC) 40 MG capsule Take 40 mg by mouth daily.    [provider]  valsartan (DIOVAN) 40 MG tablet Take 40 mg by mouth daily.    [provider]    Current Outpatient Medications  Medication Sig Dispense Refill   acetaminophen  (TYLENOL ) 650 MG CR tablet Take 650 mg by mouth daily as needed for pain.     cholecalciferol (VITAMIN D3) 25 MCG (1000 UNIT) tablet Take 1,000 Units by mouth daily.     cyclobenzaprine  (FLEXERIL ) 10 MG tablet Take 0.5-1 tablets (5-10 mg total) by mouth 3 (three) times  daily as needed for muscle spasms. (Patient not taking: Reported on 11/23/2019) 30 tablet 0   ferrous sulfate 325 (65 FE) MG EC tablet Take 325 mg by mouth 3 (three) times daily with meals.     ibuprofen  (ADVIL ) 800 MG tablet Take 1 tablet (800 mg total) by mouth every 8 (eight) hours as needed. 30 tablet 1   Multiple Vitamin (MULTIVITAMIN) tablet Take 1 tablet by mouth daily.     omeprazole (PRILOSEC) 40 MG capsule Take 40 mg by mouth daily.     valsartan (DIOVAN) 40 MG tablet Take 40 mg by mouth daily.     Current Facility-Administered Medications  Medication  Dose Route Frequency Provider Last Rate Last Admin   0.9 %  sodium chloride  infusion  500 mL Intravenous Once Federico Rosario BROCKS, MD        Allergies as of 11/20/2023   (No Known Allergies)    Family History  Problem Relation Age of Onset   Colon cancer Neg Hx    Esophageal cancer Neg Hx    Stomach cancer Neg Hx    Rectal cancer Neg Hx     Social History   Socioeconomic History   Marital status: Divorced    Spouse name: Not on file   Number of children: Not on file   Years of education: Not on file   Highest education level: Not on file  Occupational History   Not on file  Tobacco Use   Smoking status: Never   Smokeless tobacco: Never  Vaping Use   Vaping status: Never Used  Substance and Sexual Activity   Alcohol use: No   Drug use: No   Sexual activity: Never    Birth control/protection: Surgical  Other Topics Concern   Not on file  Social History Narrative   Not on file   Social Drivers of Health   Financial Resource Strain: Not on file  Food Insecurity: Not on file  Transportation Needs: Not on file  Physical Activity: Not on file  Stress: Not on file  Social Connections: Unknown (03/21/2022)   Received from [facility]   Social Network    Social Network: Not on file  Intimate Partner Violence: Unknown (03/21/2022)   Received from [facility]   HITS    Physically Hurt: Not on file    Insult or Talk Down To: Not on file    Threaten Physical Harm: Not on file    Scream or Curse: Not on file    Physical Exam: Vital signs in last 24 hours: BP (!) 148/100   Pulse 99   Temp 98.9 F (37.2 C) (Skin)   Ht 4' 11.5 (1.511 m)   Wt 146 lb (66.2 kg)   LMP 04/02/2020   SpO2 100%   BMI 28.99 kg/m  GEN: NAD EYE: Sclerae anicteric ENT: MMM CV: Non-tachycardic Pulm: No increased WOB GI: Soft NEURO:  Alert & Oriented   Estefana Federico, MD [facility]   11/20/2023 12:52 PM

## 2023-11-21 ENCOUNTER — Telehealth: Payer: Self-pay

## 2023-11-21 NOTE — Telephone Encounter (Signed)
-----   Message from Rosario JAYSON Kidney sent at 11/20/2023  2:23 PM EDT ----- Hi, let's put in a referral to bariatric surgery with [facility] to discuss how to address her previously placed lap band and further management of this.

## 2023-11-21 NOTE — Telephone Encounter (Signed)
  Follow up Call-     11/20/2023   12:45 PM  Call back number  Post procedure Call Back phone  # [phone]  Permission to leave phone message Yes     Patient questions:  Do you have a fever, pain , or abdominal swelling? No. Pain Score  0 *  Have you tolerated food without any problems? Yes.    Have you been able to return to your normal activities? Yes.    Do you have any questions about your discharge instructions: Diet   No. Medications  No. Follow up visit  No.  Do you have questions or concerns about your Care? No.  Actions: * If pain score is 4 or above: No action needed, pain <4.

## 2023-11-21 NOTE — Telephone Encounter (Signed)
 Referral, records, demographics, and insurance information faxed to [facility] at [phone]. Patient notified via MyChart.

## 2023-11-26 ENCOUNTER — Ambulatory Visit: Payer: Self-pay | Admitting: Internal Medicine

## 2023-11-26 LAB — SURGICAL PATHOLOGY

## 2023-11-29 ENCOUNTER — Other Ambulatory Visit: Payer: Self-pay | Admitting: General Surgery

## 2023-11-29 DIAGNOSIS — Z9884 Bariatric surgery status: Secondary | ICD-10-CM | POA: Diagnosis not present

## 2023-11-29 DIAGNOSIS — R1319 Other dysphagia: Secondary | ICD-10-CM | POA: Diagnosis not present

## 2023-11-29 DIAGNOSIS — R1310 Dysphagia, unspecified: Secondary | ICD-10-CM

## 2023-11-29 DIAGNOSIS — E66811 Obesity, class 1: Secondary | ICD-10-CM | POA: Diagnosis not present

## 2023-12-18 ENCOUNTER — Other Ambulatory Visit

## 2023-12-24 DIAGNOSIS — K219 Gastro-esophageal reflux disease without esophagitis: Secondary | ICD-10-CM | POA: Diagnosis not present

## 2023-12-24 DIAGNOSIS — D649 Anemia, unspecified: Secondary | ICD-10-CM | POA: Diagnosis not present

## 2023-12-24 DIAGNOSIS — Z9884 Bariatric surgery status: Secondary | ICD-10-CM | POA: Diagnosis not present

## 2023-12-24 DIAGNOSIS — E66811 Obesity, class 1: Secondary | ICD-10-CM | POA: Diagnosis not present

## 2023-12-27 ENCOUNTER — Other Ambulatory Visit

## 2024-01-03 ENCOUNTER — Other Ambulatory Visit

## 2024-01-10 ENCOUNTER — Other Ambulatory Visit

## 2024-01-25 ENCOUNTER — Ambulatory Visit: Admitting: Physician Assistant

## 2024-01-31 ENCOUNTER — Inpatient Hospital Stay: Admission: RE | Admit: 2024-01-31 | Source: Ambulatory Visit

## 2024-02-14 DIAGNOSIS — Z9884 Bariatric surgery status: Secondary | ICD-10-CM | POA: Diagnosis not present

## 2024-02-14 DIAGNOSIS — D709 Neutropenia, unspecified: Secondary | ICD-10-CM | POA: Diagnosis not present

## 2024-02-14 DIAGNOSIS — D72819 Decreased white blood cell count, unspecified: Secondary | ICD-10-CM | POA: Diagnosis not present

## 2024-02-14 DIAGNOSIS — D649 Anemia, unspecified: Secondary | ICD-10-CM | POA: Diagnosis not present

## 2024-02-18 DIAGNOSIS — D509 Iron deficiency anemia, unspecified: Secondary | ICD-10-CM | POA: Diagnosis not present

## 2024-02-18 DIAGNOSIS — E559 Vitamin D deficiency, unspecified: Secondary | ICD-10-CM | POA: Diagnosis not present

## 2024-02-18 DIAGNOSIS — K219 Gastro-esophageal reflux disease without esophagitis: Secondary | ICD-10-CM | POA: Diagnosis not present

## 2024-02-18 DIAGNOSIS — I10 Essential (primary) hypertension: Secondary | ICD-10-CM | POA: Diagnosis not present

## 2024-02-18 DIAGNOSIS — Z862 Personal history of diseases of the blood and blood-forming organs and certain disorders involving the immune mechanism: Secondary | ICD-10-CM | POA: Diagnosis not present

## 2024-03-03 NOTE — Progress Notes (Deleted)
 03/03/2024 Tonya Barber [id number] 1970-10-17  Referring provider: Waylan Almarie SAUNDERS, MD Primary GI doctor: Dr. Federico  ASSESSMENT AND PLAN:  GERD/dysphagia history of H. pylori 11/20/2023 EGD benign-appearing stenosis status post dilation, small HH, gastritis normal duodenum.  Path moderate chronic gastritis negative H. pylori negative metaplasia negative EOE  History of gastric lap band Following with Dr. Tanda [facility] Scheduled for upper GI 03/06/2024  [facility] 11/20/2023 colonoscopy for screening internal hemorrhoids recall 10 years  Patient Care Team: Waylan Almarie SAUNDERS, MD as PCP - General (Family Medicine)  HISTORY OF PRESENT ILLNESS: 53 y.o. female with a past medical history listed below presents for evaluation of ***.   11/20/2023 colonoscopy and endoscopy by Dr. Federico  *** Discussed the use of AI scribe software for clinical note transcription with the patient, who gave verbal consent to proceed.  History of Present Illness            She  reports that she has never smoked. She has never used smokeless tobacco. She reports that she does not drink alcohol and does not use drugs.  RELEVANT GI HISTORY, IMAGING AND LABS: Results          CBC    Component Value Date/Time   WBC 6.6 04/08/2020 0520   RBC 2.89 (L) 04/08/2020 0520   HGB 6.7 (LL) 04/08/2020 0520   HCT 22.6 (L) 04/08/2020 0520   PLT 252 04/08/2020 0520   MCV 78.2 (L) 04/08/2020 0520   MCH 23.2 (L) 04/08/2020 0520   MCHC 29.6 (L) 04/08/2020 0520   RDW 16.4 (H) 04/08/2020 0520   LYMPHSABS 1.4 06/09/2011 1459   MONOABS 1.3 (H) 06/09/2011 1459   EOSABS 0.0 06/09/2011 1459   BASOSABS 0.0 06/09/2011 1459   No results for input(s): HGB in the last 8760 hours.  CMP     Component Value Date/Time   NA 140 11/23/2019 0422   K 3.4 (L) 11/23/2019 0422   CL 110 11/23/2019 0422   CO2 22 11/23/2019 0422   GLUCOSE 106 (H) 11/23/2019 0422   BUN 10 11/23/2019 0422   CREATININE 0.48 11/23/2019  0422   CALCIUM 9.0 11/23/2019 0422   GFRNONAA >60 11/23/2019 0422   GFRAA >60 11/23/2019 0422       No data to display            Current Medications:    Current Outpatient Medications (Cardiovascular):    valsartan (DIOVAN) 40 MG tablet, Take 40 mg by mouth daily.   Current Outpatient Medications (Analgesics):    acetaminophen  (TYLENOL ) 650 MG CR tablet, Take 650 mg by mouth daily as needed for pain.   Current Outpatient Medications (Other):    cholecalciferol (VITAMIN D3) 25 MCG (1000 UNIT) tablet, Take 1,000 Units by mouth daily.   Multiple Vitamin (MULTIVITAMIN) tablet, Take 1 tablet by mouth daily.   omeprazole (PRILOSEC) 40 MG capsule, Take 40 mg by mouth daily.  Medical History:  Past Medical History:  Diagnosis Date   Anemia    Anxiety    Blood transfusion without reported diagnosis    Complication of anesthesia    It is hard to wake up after anesthesia.   GERD (gastroesophageal reflux disease)    Heart murmur    Hypertension    Allergies: No Known Allergies   Surgical History:  She  has a past surgical history that includes Laparoscopic gastric banding; Tubal ligation; IUD removal; and Robotic assisted total hysterectomy with bilateral salpingo oophorectomy (Bilateral, 04/07/2020). Family History:  Her family history is not on file.  REVIEW OF SYSTEMS  : All other systems reviewed and negative except where noted in the History of Present Illness.  PHYSICAL EXAM: LMP 04/02/2020  Physical Exam          Alan JONELLE Coombs, PA-C 7:43 AM

## 2024-03-04 ENCOUNTER — Ambulatory Visit: Admitting: Physician Assistant

## 2024-03-06 ENCOUNTER — Inpatient Hospital Stay: Admission: RE | Admit: 2024-03-06 | Source: Ambulatory Visit

## 2024-03-24 DIAGNOSIS — L02412 Cutaneous abscess of left axilla: Secondary | ICD-10-CM | POA: Diagnosis not present

## 2024-03-24 DIAGNOSIS — E669 Obesity, unspecified: Secondary | ICD-10-CM | POA: Diagnosis not present

## 2024-04-17 ENCOUNTER — Ambulatory Visit: Admitting: Gastroenterology

## 2024-04-17 NOTE — Progress Notes (Deleted)
 Tonya Barber
